# Patient Record
Sex: Female | Born: 1970 | Hispanic: No | Marital: Married | Smoking: Never smoker
Health system: Southern US, Community
[De-identification: ages and names within clinical notes are randomized; demographics above are authoritative.]

## PROBLEM LIST (undated history)

## (undated) DIAGNOSIS — R51 Headache: Secondary | ICD-10-CM

## (undated) DIAGNOSIS — R519 Headache, unspecified: Secondary | ICD-10-CM

## (undated) DIAGNOSIS — I10 Essential (primary) hypertension: Secondary | ICD-10-CM

## (undated) HISTORY — PX: SKIN GRAFT: SHX250

## (undated) HISTORY — DX: Essential (primary) hypertension: I10

## (undated) HISTORY — PX: EYE SURGERY: SHX253

## (undated) HISTORY — DX: Headache, unspecified: R51.9

## (undated) HISTORY — PX: TUBAL LIGATION: SHX77

## (undated) HISTORY — DX: Headache: R51

## (undated) HISTORY — PX: NASAL SINUS SURGERY: SHX719

---

## 1999-01-18 ENCOUNTER — Other Ambulatory Visit: Admission: RE | Admit: 1999-01-18 | Discharge: 1999-01-18 | Payer: Self-pay | Admitting: Gynecology

## 2001-01-26 ENCOUNTER — Other Ambulatory Visit: Admission: RE | Admit: 2001-01-26 | Discharge: 2001-01-26 | Payer: Self-pay | Admitting: Gynecology

## 2003-01-16 ENCOUNTER — Other Ambulatory Visit: Admission: RE | Admit: 2003-01-16 | Discharge: 2003-01-16 | Payer: Self-pay | Admitting: Obstetrics and Gynecology

## 2003-11-08 ENCOUNTER — Ambulatory Visit (HOSPITAL_COMMUNITY): Admission: RE | Admit: 2003-11-08 | Discharge: 2003-11-08 | Payer: Self-pay | Admitting: Internal Medicine

## 2004-04-01 ENCOUNTER — Encounter: Admission: RE | Admit: 2004-04-01 | Discharge: 2004-04-01 | Payer: Self-pay | Admitting: Otolaryngology

## 2006-09-01 ENCOUNTER — Other Ambulatory Visit: Admission: RE | Admit: 2006-09-01 | Discharge: 2006-09-01 | Payer: Self-pay | Admitting: Obstetrics and Gynecology

## 2012-03-05 ENCOUNTER — Other Ambulatory Visit: Payer: Self-pay | Admitting: Obstetrics and Gynecology

## 2012-03-05 NOTE — Telephone Encounter (Signed)
Carrie Buckley received 

## 2012-03-09 NOTE — Telephone Encounter (Signed)
lmtc @10 :25  ld

## 2012-03-13 ENCOUNTER — Other Ambulatory Visit: Payer: Self-pay | Admitting: Obstetrics and Gynecology

## 2012-03-13 NOTE — Telephone Encounter (Signed)
Triage/pt last seen in 2012 °

## 2012-03-13 NOTE — Telephone Encounter (Signed)
SR PT 

## 2012-03-14 NOTE — Telephone Encounter (Signed)
Pt sch for AEX on 03-16-12 ld

## 2012-03-14 NOTE — Telephone Encounter (Signed)
Requesting refill on naproxen.  This ok or need appt?   ld

## 2012-03-14 NOTE — Telephone Encounter (Signed)
Always include last visit in your note and/or last AEX

## 2012-03-16 ENCOUNTER — Encounter: Payer: Self-pay | Admitting: Obstetrics and Gynecology

## 2012-03-16 ENCOUNTER — Ambulatory Visit (INDEPENDENT_AMBULATORY_CARE_PROVIDER_SITE_OTHER): Payer: PRIVATE HEALTH INSURANCE | Admitting: Obstetrics and Gynecology

## 2012-03-16 VITALS — BP 126/70 | Ht 65.0 in | Wt 170.0 lb

## 2012-03-16 DIAGNOSIS — Z01419 Encounter for gynecological examination (general) (routine) without abnormal findings: Secondary | ICD-10-CM

## 2012-03-16 DIAGNOSIS — Z124 Encounter for screening for malignant neoplasm of cervix: Secondary | ICD-10-CM

## 2012-03-16 MED ORDER — NAPROXEN SODIUM 550 MG PO TABS: 550.0000 mg | ORAL_TABLET | Freq: Two times a day (BID) | ORAL | Status: AC

## 2012-03-16 NOTE — Progress Notes (Signed)
The patient reports:no complaints  Contraception:bilateral tubal ligation  Last mammogram: not applicable Last pap: approximate date 11/2010 and was normal   GC/Chlamydia cultures offered: declined HIV/RPR/HbsAg offered:  declined HSV 1 and 2 glycoprotein offered: declined  Menstrual cycle regular and monthly: Yes Menstrual flow normal: Yes  Urinary symptoms: none Normal bowel movements: Yes Reports abuse at home: No:   Subjective:    Carrie Buckley is a 41 y.o. female, G1P1, who presents for an annual exam.     History   Social History  . Marital Status: Married    Spouse Name: N/A    Number of Children: N/A  . Years of Education: N/A   Social History Main Topics  . Smoking status: Never Smoker   . Smokeless tobacco: Never Used  . Alcohol Use: No  . Drug Use: No  . Sexually Active: Yes    Birth Control/ Protection: Surgical   Other Topics Concern  . None   Social History Narrative  . None    Menstrual cycle:   LMP: Patient's last menstrual period was 03/12/2012.           Cycle: monthly with controlled dysmenorrhea  With Anaprox Ds  The following portions of the patient's history were reviewed and updated as appropriate: allergies, current medications, past family history, past medical history, past social history, past surgical history and problem list.  Review of Systems Pertinent items are noted in HPI. Breast:Negative for breast lump,nipple discharge or nipple retraction Gastrointestinal: Negative for abdominal pain, change in bowel habits or rectal bleeding Urinary:negative   Objective:    BP 126/70  Ht 5\' 5"  (1.651 m)  Wt 170 lb (77.111 kg)  BMI 28.29 kg/m2  LMP 03/12/2012    Weight:  Wt Readings from Last 1 Encounters:  03/16/12 170 lb (77.111 kg)          BMI: Body mass index is 28.29 kg/(m^2).  General Appearance: Alert, appropriate appearance for age. No acute distress HEENT: Grossly normal Neck / Thyroid: Supple, no masses, nodes or  enlargement Lungs: clear to auscultation bilaterally Back: No CVA tenderness Breast Exam: No masses or nodes.No dimpling, nipple retraction or discharge. Cardiovascular: Regular rate and rhythm. S1, S2, no murmur Gastrointestinal: Soft, non-tender, no masses or organomegaly Pelvic Exam: Vulva and vagina appear normal. Bimanual exam reveals normal uterus and adnexa. Rectovaginal: not indicated Lymphatic Exam: Non-palpable nodes in neck, clavicular, axillary, or inguinal regions Skin: no rash or abnormalities Neurologic: Normal gait and speech, no tremor  Psychiatric: Alert and oriented, appropriate affect.    Assessment:    Normal gyn exam    Plan:    pap smear return annually or prn STD screening: declined Contraception:bilateral tubal ligation Anaprox DS      Blessen Kimbrough AMD

## 2012-03-22 LAB — PAP IG W/ RFLX HPV ASCU

## 2012-05-06 ENCOUNTER — Ambulatory Visit (INDEPENDENT_AMBULATORY_CARE_PROVIDER_SITE_OTHER): Payer: PRIVATE HEALTH INSURANCE | Admitting: Family Medicine

## 2012-05-06 VITALS — BP 107/68 | HR 88 | Temp 98.6°F | Resp 16 | Ht 64.25 in | Wt 171.0 lb

## 2012-05-06 DIAGNOSIS — R05 Cough: Secondary | ICD-10-CM

## 2012-05-06 DIAGNOSIS — J329 Chronic sinusitis, unspecified: Secondary | ICD-10-CM

## 2012-05-06 DIAGNOSIS — R059 Cough, unspecified: Secondary | ICD-10-CM

## 2012-05-06 MED ORDER — CEFDINIR 300 MG PO CAPS: 300.0000 mg | ORAL_CAPSULE | Freq: Two times a day (BID) | ORAL | Status: AC

## 2012-05-06 MED ORDER — HYDROCODONE-HOMATROPINE 5-1.5 MG/5ML PO SYRP: 5.0000 mL | ORAL_SOLUTION | Freq: Three times a day (TID) | ORAL | Status: AC | PRN

## 2012-05-06 NOTE — Progress Notes (Signed)
Date:  05/06/2012   Name:  Carrie Buckley   DOB:  [birth year]   MRN:  [id number]  PCP:  Esmeralda Arthur, MD    Chief Complaint: Cough and Nasal Congestion   History of Present Illness:  Carrie Buckley is a 41 y.o. very pleasant female patient who presents with the following:  She has noted a severe cough, sinus pressure, and some productive cough for about a week.  Her sinuses are starting to hurt. She has not noted a fever.  Some ST from PND, no nasal symtoms.  No GI symptoms. No sick contacts.  She has tried some OTC medications and veramyst that she had at home.    S/p BTL. She is generally healthy  There is no problem list on file for this patient.   Past Medical History  Diagnosis Date  . Hypertension     Past Surgical History  Procedure Date  . Tubal ligation   . Cesarean section     History  Substance Use Topics  . Smoking status: Never Smoker   . Smokeless tobacco: Never Used  . Alcohol Use: No    Family History  Problem Relation Age of Onset  . Cancer Maternal Aunt     colon  . Hypertension Maternal Aunt   . Hypertension Maternal Uncle   . Hypertension Paternal Aunt   . Hypertension Paternal Uncle     No Known Allergies  Medication list has been reviewed and updated.  Current Outpatient Prescriptions on File Prior to Visit  Medication Sig Dispense Refill  . cyanocobalamin 2000 MCG tablet Take 2,000 mcg by mouth daily.      . Flaxseed, Linseed, (FLAX SEED OIL PO) Take by mouth.      . loratadine-pseudoephedrine (CLARITIN-D 24-HOUR) 10-240 MG per 24 hr tablet Take 1 tablet by mouth daily.      . Multiple Vitamin (MULTIVITAMIN) tablet Take 1 tablet by mouth daily.      . naproxen sodium (ANAPROX DS) 550 MG tablet Take 1 tablet (550 mg total) by mouth 2 (two) times daily with a meal.  30 tablet  11  . pyridoxine (RA VITAMIN B-6) 100 MG tablet Take 100 mg by mouth daily.        Review of Systems:  As per HPI- otherwise  negative. Marland Kitchen Physical Examination: Filed Vitals:   05/06/12 1740  BP: 107/68  Pulse: 88  Temp: 98.6 F (37 C)  Resp: 16   Filed Vitals:   05/06/12 1740  Height: 5' 4.25" (1.632 m)  Weight: 171 lb (77.565 kg)   Body mass index is 29.12 kg/(m^2). Ideal Body Weight: Weight in (lb) to have BMI = 25: 146.5   GEN: WDWN, NAD, Non-toxic, A & O x 3 HEENT: Atraumatic, Normocephalic. Neck supple. No masses, No LAD.  PEERL, EOMI, oropharynx wnl, TM wnl.  Sinuses are tender to percussion, nasal cavity wnl Ears and Nose: No external deformity. CV: RRR, No M/G/R. No JVD. No thrill. No extra heart sounds. PULM: CTA B, no wheezes, crackles, rhonchi. No retractions. No resp. distress. No accessory muscle use. ABD: S, NT, ND, +BS. No rebound. No HSM. EXTR: No c/c/e NEURO Normal gait.  PSYCH: Normally interactive. Conversant. Not depressed or anxious appearing.  Calm demeanor.    Assessment and Plan: 1. Cough  HYDROcodone-homatropine (HYCODAN) 5-1.5 MG/5ML syrup  2. Sinusitis  cefdinir (OMNICEF) 300 MG capsule   Treat as above for sinusitis and cough/ bronchitis.  Patient (or parent if minor) instructed  to return to clinic or call if not better in 2-3 day(s).   Abbe Amsterdam, MD

## 2012-11-09 ENCOUNTER — Ambulatory Visit (INDEPENDENT_AMBULATORY_CARE_PROVIDER_SITE_OTHER): Payer: PRIVATE HEALTH INSURANCE | Admitting: Internal Medicine

## 2012-11-09 VITALS — BP 108/70 | HR 83 | Temp 98.6°F | Resp 18 | Ht 65.0 in | Wt 175.0 lb

## 2012-11-09 DIAGNOSIS — R110 Nausea: Secondary | ICD-10-CM

## 2012-11-09 DIAGNOSIS — G43909 Migraine, unspecified, not intractable, without status migrainosus: Secondary | ICD-10-CM

## 2012-11-09 DIAGNOSIS — H53149 Visual discomfort, unspecified: Secondary | ICD-10-CM

## 2012-11-09 MED ORDER — PROMETHAZINE HCL 25 MG PO TABS: 25.0000 mg | ORAL_TABLET | Freq: Three times a day (TID) | ORAL | Status: DC | PRN

## 2012-11-09 MED ORDER — KETOROLAC TROMETHAMINE 60 MG/2ML IM SOLN
60.0000 mg | Freq: Once | INTRAMUSCULAR | Status: AC
  Administered 2012-11-09: 60 mg via INTRAMUSCULAR

## 2012-11-09 MED ORDER — HYDROCODONE-ACETAMINOPHEN 5-325 MG PO TABS: 1.0000 | ORAL_TABLET | Freq: Four times a day (QID) | ORAL | Status: DC | PRN

## 2012-11-09 NOTE — Patient Instructions (Signed)
Migraine Headache A migraine headache is an intense, throbbing pain on one or both sides of your head. A migraine can last for 30 minutes to several hours. CAUSES  The exact cause of a migraine headache is not always known. However, a migraine may be caused when nerves in the brain become irritated and release chemicals that cause inflammation. This causes pain. SYMPTOMS  Pain on one or both sides of your head.  Pulsating or throbbing pain.  Severe pain that prevents daily activities.  Pain that is aggravated by any physical activity.  Nausea, vomiting, or both.  Dizziness.  Pain with exposure to bright lights, loud noises, or activity.  General sensitivity to bright lights, loud noises, or smells. Before you get a migraine, you may get warning signs that a migraine is coming (aura). An aura may include:  Seeing flashing lights.  Seeing bright spots, halos, or zig-zag lines.  Having tunnel vision or blurred vision.  Having feelings of numbness or tingling.  Having trouble talking.  Having muscle weakness. MIGRAINE TRIGGERS  Alcohol.  Smoking.  Stress.  Menstruation.  Aged cheeses.  Foods or drinks that contain nitrates, glutamate, aspartame, or tyramine.  Lack of sleep.  Chocolate.  Caffeine.  Hunger.  Physical exertion.  Fatigue.  Medicines used to treat chest pain (nitroglycerine), birth control pills, estrogen, and some blood pressure medicines. DIAGNOSIS  A migraine headache is often diagnosed based on:  Symptoms.  Physical examination.  A CT scan or MRI of your head. TREATMENT Medicines may be given for pain and nausea. Medicines can also be given to help prevent recurrent migraines.  HOME CARE INSTRUCTIONS  Only take over-the-counter or prescription medicines for pain or discomfort as directed by your caregiver. The use of long-term narcotics is not recommended.  Lie down in a dark, quiet room when you have a migraine.  Keep a journal  to find out what may trigger your migraine headaches. For example, write down:  What you eat and drink.  How much sleep you get.  Any change to your diet or medicines.  Limit alcohol consumption.  Quit smoking if you smoke.  Get 7 to 9 hours of sleep, or as recommended by your caregiver.  Limit stress.  Keep lights dim if bright lights bother you and make your migraines worse. SEEK IMMEDIATE MEDICAL CARE IF:   Your migraine becomes severe.  You have a fever.  You have a stiff neck.  You have vision loss.  You have muscular weakness or loss of muscle control.  You start losing your balance or have trouble walking.  You feel faint or pass out.  You have severe symptoms that are different from your first symptoms. MAKE SURE YOU:   Understand these instructions.  Will watch your condition.  Will get help right away if you are not doing well or get worse. Document Released: 10/10/2005 Document Revised: 01/02/2012 Document Reviewed: 09/30/2011 [employer] Patient Information 2013 [employer].  

## 2012-11-09 NOTE — Progress Notes (Signed)
  Subjective:    Patient ID: Carrie Buckley, female    DOB: 1971-08-22, 42 y.o.   MRN: [id number]  HPI Has HA for 5 days.Photophobia, nausea, no vomiting. Hx of full migraine eval, ct head and sinus , and neurology eval 2005. At this time has no focal motor or sensory loss. No definite fhx of migrain. Has not had migrain in months.   Review of Systems neg    Objective:   Physical Exam  Constitutional: She is oriented to person, place, and time. She appears well-developed and well-nourished. She appears distressed.  HENT:  Right Ear: External ear normal.  Left Ear: External ear normal.  Nose: Nose normal.  Eyes: EOM are normal. Pupils are equal, round, and reactive to light.  Neck: Normal range of motion. Neck supple.  Cardiovascular: Normal rate.   Pulmonary/Chest: Effort normal.  Neurological: She is alert and oriented to person, place, and time. She has normal reflexes. She displays no tremor. No cranial nerve deficit or sensory deficit. She exhibits normal muscle tone. She displays a negative Romberg sign. Coordination and gait normal.       No drift Balance fully intact          Assessment & Plan:  Toradol 60mg  im Phenergan 25mg  for nausea, migrain, and sleep HC for rescue Needs primary care to plan migraine prevention

## 2014-07-07 ENCOUNTER — Other Ambulatory Visit: Payer: Self-pay | Admitting: Otolaryngology

## 2014-07-07 DIAGNOSIS — R42 Dizziness and giddiness: Secondary | ICD-10-CM

## 2014-07-18 ENCOUNTER — Ambulatory Visit
Admission: RE | Admit: 2014-07-18 | Discharge: 2014-07-18 | Disposition: A | Discharge status: Home / Self Care | Payer: PRIVATE HEALTH INSURANCE | Source: Ambulatory Visit | Attending: Otolaryngology | Admitting: Otolaryngology

## 2014-07-18 DIAGNOSIS — R42 Dizziness and giddiness: Secondary | ICD-10-CM

## 2014-07-31 ENCOUNTER — Encounter (INDEPENDENT_AMBULATORY_CARE_PROVIDER_SITE_OTHER): Payer: Self-pay

## 2014-07-31 ENCOUNTER — Ambulatory Visit (INDEPENDENT_AMBULATORY_CARE_PROVIDER_SITE_OTHER): Payer: PRIVATE HEALTH INSURANCE | Admitting: Neurology

## 2014-07-31 ENCOUNTER — Encounter: Payer: Self-pay | Admitting: Neurology

## 2014-07-31 VITALS — BP 130/92 | HR 92 | Ht 65.0 in | Wt 179.8 lb

## 2014-07-31 DIAGNOSIS — I639 Cerebral infarction, unspecified: Secondary | ICD-10-CM

## 2014-07-31 DIAGNOSIS — R42 Dizziness and giddiness: Secondary | ICD-10-CM | POA: Insufficient documentation

## 2014-07-31 DIAGNOSIS — G43709 Chronic migraine without aura, not intractable, without status migrainosus: Secondary | ICD-10-CM

## 2014-07-31 MED ORDER — TOPIRAMATE 25 MG PO TABS: ORAL_TABLET | ORAL | Status: DC

## 2014-07-31 NOTE — Patient Instructions (Signed)

## 2014-07-31 NOTE — Progress Notes (Signed)
Reason for visit: Dizziness  Carrie Buckley is a 43 y.o. female  History of present illness:  Carrie Buckley is a 43 year old left-handed white female with a history of migraine headache. The patient has done quite well with her migraine until May of 2015. The patient has not had any headaches for almost 10 years. The patient was on a cruise, and then several days after the cruise, she had an episode of visual loss, and then the next day she had a severe migraine headache. The patient indicates that she oftentimes will have visual aura prior to her headaches. Beginning in July 2015, the patient began having brief episodes of lightheaded or floaty feelings that would last only a few seconds, but may occur multiple times during the day. The patient indicates that she usually does not have these events while lying down, but they may occur while sitting or standing. The patient has also concurrently noted onset of visual aura that seemed to simulate what she may get prior to her headache. The patient is also having alterations in her peripheral vision associated with sparkling lights bilaterally. The patient reports no numbness or weakness on the face, arms, or legs. She denies any significant change in balance, and she has had no weakness of the extremities. She has not had any double vision, or slurred speech. The patient has been seen by Dr. Gean Quint, and for MRI evaluation of the brain. The patient was found to have small areas within the left cerebellum consistent with a chronic infarction. Upon my review, the areas appeared to be almost cystic. The patient has also recently developed some slight neck discomfort and some pain into the interscapular area on the right that began about 3 weeks ago. The patient denies any pain down the arms. She has not had any problems controlling the bowels or the bladder. She does report some occasional episodes of chills. She is sent to this office for an  evaluation.  Past Medical History  Diagnosis Date  . Hypertension   . Headache     Past Surgical History  Procedure Laterality Date  . Tubal ligation    . Cesarean section    . Nasal sinus surgery      Family History  Problem Relation Age of Onset  . Cancer Maternal Aunt     colon  . Hypertension Maternal Aunt   . Hypertension Maternal Uncle   . Hypertension Paternal Aunt   . Hypertension Paternal Uncle   . Cancer Mother   . Migraines Neg Hx     Social history:  reports that she has never smoked. She has never used smokeless tobacco. She reports that she does not drink alcohol or use illicit drugs.  Medications:  Current Outpatient Prescriptions on File Prior to Visit  Medication Sig Dispense Refill  . cyanocobalamin 2000 MCG tablet Take 2,000 mcg by mouth daily.      . Flaxseed, Linseed, (FLAX SEED OIL PO) Take by mouth.      Marland Kitchen HYDROcodone-acetaminophen (NORCO/VICODIN) 5-325 MG per tablet Take 1 tablet by mouth every 6 (six) hours as needed for pain.  30 tablet  0  . loratadine-pseudoephedrine (CLARITIN-D 24-HOUR) 10-240 MG per 24 hr tablet Take 1 tablet by mouth daily.      . Multiple Vitamin (MULTIVITAMIN) tablet Take 1 tablet by mouth daily.      Marland Kitchen pyridoxine (RA VITAMIN B-6) 100 MG tablet Take 100 mg by mouth daily.       No  current facility-administered medications on file prior to visit.     No Known Allergies  ROS:  Out of a complete 14 system review of symptoms, the patient complains only of the following symptoms, and all other reviewed systems are negative.  Wheezing Allergies Headache Dizziness  Blood pressure 130/92, pulse 92, height 5\' 5"  (1.651 m), weight 179 lb 12.8 oz (81.557 kg).  Physical Exam  General: The patient is alert and cooperative at the time of the examination. The patient is minimally obese.  Eyes: Pupils are equal, round, and reactive to light. Discs are flat bilaterally.  Neck: The neck is supple, no carotid bruits are  noted.  Respiratory: The respiratory examination is clear.  Cardiovascular: The cardiovascular examination reveals a regular rate and rhythm, no obvious murmurs or rubs are noted.  Skin: Extremities are without significant edema.  Neurologic Exam  Mental status: The patient is alert and oriented x 3 at the time of the examination. The patient has apparent normal recent and remote memory, with an apparently normal attention span and concentration ability.  Cranial nerves: Facial symmetry is present. There is good sensation of the face to pinprick and soft touch bilaterally. The strength of the facial muscles and the muscles to head turning and shoulder shrug are normal bilaterally. Speech is well enunciated, no aphasia or dysarthria is noted. Extraocular movements are full. On primary gaze, there is exotropia of the left eye. Visual fields are full. The tongue is midline, and the patient has symmetric elevation of the soft palate. No obvious hearing deficits are noted.  Motor: The motor testing reveals 5 over 5 strength of all 4 extremities. Good symmetric motor tone is noted throughout.  Sensory: Sensory testing is intact to pinprick, soft touch, vibration sensation, and position sense on all 4 extremities. No evidence of extinction is noted.  Coordination: Cerebellar testing reveals good finger-nose-finger and heel-to-shin bilaterally.  Gait and station: Gait is normal. Tandem gait is normal. Romberg is negative. No drift is seen.  Reflexes: Deep tendon reflexes are symmetric and normal bilaterally. Toes are downgoing bilaterally.   Assessment/Plan:  1. 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 History of migraine headache  3. Episodic dizziness, light headed sensations  I am not clear that the MRI findings are related to her current symptoms. The patient reports that the episodes of brief dizziness are daily in nature, and quite frequent. The onset of  the symptoms began in conjunction with her usual migraine visual aura. The patient has continued to have episodic scintillating scotoma in the peripheral vision since July of 2015, unassociated with headache. The episodes of dizziness may represent migraine equivalent. The patient will be placed on Topamax, and she will be sent for a cerebrovascular workup to include MRA of the head and neck, and blood work to evaluate her for a hypercoagulable state. The patient will followup in about 3 months. The MRI of the brain can be followed over time, and we may consider rechecking a study in about one year.  Marlan Palau. Keith Willis MD 07/31/2014 7:54 PM  [facility] [address] [place], [place] [id number]  Phone [phone] Fax [phone]

## 2014-08-04 LAB — CARDIOLIPIN ANTIBODY
Anticardiolipin IgA: <9 APL U/mL (ref 0–11)
Anticardiolipin IgG: <9 GPL U/mL (ref 0–14)
Anticardiolipin IgM: <9 MPL U/mL (ref 0–12)

## 2014-08-04 LAB — LUPUS ANTICOAGULANT
Dilute Viper Venom Time: 32.7 s (ref 0.0–55.1)
PTT Lupus Anticoagulant: 30.7 s (ref 0.0–50.0)
Thrombin Time: 16.3 s (ref 0.0–20.0)
dPT Confirm Ratio: 1.03 Ratio (ref 0.00–1.20)
dPT: 43.2 s (ref 0.0–55.0)

## 2014-08-04 LAB — SEDIMENTATION RATE: Sed Rate: 8 mm/hr (ref 0–32)

## 2014-08-04 LAB — FACTOR 5 LEIDEN

## 2014-08-04 LAB — ANA W/REFLEX: Anti Nuclear Antibody(ANA): NEGATIVE

## 2014-08-11 ENCOUNTER — Ambulatory Visit
Admission: RE | Admit: 2014-08-11 | Discharge: 2014-08-11 | Disposition: A | Discharge status: Home / Self Care | Payer: PRIVATE HEALTH INSURANCE | Source: Ambulatory Visit | Attending: Neurology | Admitting: Neurology

## 2014-08-11 DIAGNOSIS — I639 Cerebral infarction, unspecified: Secondary | ICD-10-CM

## 2014-08-11 DIAGNOSIS — R42 Dizziness and giddiness: Secondary | ICD-10-CM

## 2014-08-11 DIAGNOSIS — G43709 Chronic migraine without aura, not intractable, without status migrainosus: Secondary | ICD-10-CM

## 2014-08-11 MED ORDER — GADOBENATE DIMEGLUMINE 529 MG/ML IV SOLN
15.0000 mL | Freq: Once | INTRAVENOUS | Status: AC | PRN
  Administered 2014-08-11: 15 mL via INTRAVENOUS

## 2014-08-12 ENCOUNTER — Telehealth: Payer: Self-pay | Admitting: Neurology

## 2014-08-12 NOTE — Telephone Encounter (Signed)
  I called patient. 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 The patient has a chronic left cerebellar stroke, and this could correlate with the vertebral artery disease. The patient will go on low-dose aspirin at this time. She will followup in about 3 months. I am not sure that the old chronic left cerebellar stroke is the etiology of her symptoms of dizziness.   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 This may be a source of emboli causing the left cerebellar infarction noted on prior MRI brain. No other significant vertebral or carotid stenosis identified.  Negative MRA head.

## 2014-08-19 ENCOUNTER — Telehealth: Payer: Self-pay | Admitting: Neurology

## 2014-08-19 MED ORDER — TOPIRAMATE 25 MG PO TABS: 75.0000 mg | ORAL_TABLET | Freq: Every day | ORAL | Status: DC

## 2014-08-19 NOTE — Telephone Encounter (Signed)
I called the patient. The patient may go up to 75 mg daily of the Topamax. I will call in another prescription to cover this increase.

## 2014-08-19 NOTE — Telephone Encounter (Signed)
Patient calling to ask whether it is okay for her to increase her Topamax dosage to 3 pills a day, please return call and advise.

## 2014-08-25 ENCOUNTER — Encounter: Payer: Self-pay | Admitting: Neurology

## 2014-09-15 ENCOUNTER — Telehealth: Payer: Self-pay | Admitting: Neurology

## 2014-09-15 MED ORDER — TOPIRAMATE 100 MG PO TABS: 150.0000 mg | ORAL_TABLET | Freq: Every day | ORAL | Status: DC

## 2014-09-15 NOTE — Telephone Encounter (Signed)
I called patient. The patient is tolerating 125 mg at night of the Topamax, okay to go to 150 mg, the patient believes that this is helping her.

## 2014-09-15 NOTE — Telephone Encounter (Signed)
What do you think??

## 2014-09-15 NOTE — Telephone Encounter (Signed)
Pt is calling regarding topiramate (TOPAMAX) 25 MG tablet she has increased to 5 pills a day about a week ago. She is taking 125 mg daily. She wants to know if she can go to 6 pills a day.  Please call and advise.

## 2014-11-18 ENCOUNTER — Ambulatory Visit (INDEPENDENT_AMBULATORY_CARE_PROVIDER_SITE_OTHER): Payer: PRIVATE HEALTH INSURANCE | Admitting: Neurology

## 2014-11-18 ENCOUNTER — Encounter: Payer: Self-pay | Admitting: Neurology

## 2014-11-18 VITALS — BP 113/78 | HR 95 | Ht 65.0 in | Wt 175.2 lb

## 2014-11-18 DIAGNOSIS — R42 Dizziness and giddiness: Secondary | ICD-10-CM

## 2014-11-18 DIAGNOSIS — G43709 Chronic migraine without aura, not intractable, without status migrainosus: Secondary | ICD-10-CM

## 2014-11-18 NOTE — Patient Instructions (Addendum)
  Begin taking Topamax 50 mg in the morning, 100 mg in the evening. Call our office if this is not effective. Migraine Headache A migraine headache is an intense, throbbing pain on one or both sides of your head. A migraine can last for 30 minutes to several hours. CAUSES  The exact cause of a migraine headache is not always known. However, a migraine may be caused when nerves in the brain become irritated and release chemicals that cause inflammation. This causes pain. Certain things may also trigger migraines, such as:  Alcohol.  Smoking.  Stress.  Menstruation.  Aged cheeses.  Foods or drinks that contain nitrates, glutamate, aspartame, or tyramine.  Lack of sleep.  Chocolate.  Caffeine.  Hunger.  Physical exertion.  Fatigue.  Medicines used to treat chest pain (nitroglycerine), birth control pills, estrogen, and some blood pressure medicines. SIGNS AND SYMPTOMS  Pain on one or both sides of your head.  Pulsating or throbbing pain.  Severe pain that prevents daily activities.  Pain that is aggravated by any physical activity.  Nausea, vomiting, or both.  Dizziness.  Pain with exposure to bright lights, loud noises, or activity.  General sensitivity to bright lights, loud noises, or smells. Before you get a migraine, you may get warning signs that a migraine is coming (aura). An aura may include:  Seeing flashing lights.  Seeing bright spots, halos, or zigzag lines.  Having tunnel vision or blurred vision.  Having feelings of numbness or tingling.  Having trouble talking.  Having muscle weakness. DIAGNOSIS  A migraine headache is often diagnosed based on:  Symptoms.  Physical exam.  A CT scan or MRI of your head. These imaging tests cannot diagnose migraines, but they can help rule out other causes of headaches. TREATMENT Medicines may be given for pain and nausea. Medicines can also be given to help prevent recurrent migraines.  HOME CARE  INSTRUCTIONS  Only take over-the-counter or prescription medicines for pain or discomfort as directed by your health care provider. The use of long-term narcotics is not recommended.  Lie down in a dark, quiet room when you have a migraine.  Keep a journal to find out what may trigger your migraine headaches. For example, write down:  What you eat and drink.  How much sleep you get.  Any change to your diet or medicines.  Limit alcohol consumption.  Quit smoking if you smoke.  Get 7-9 hours of sleep, or as recommended by your health care provider.  Limit stress.  Keep lights dim if bright lights bother you and make your migraines worse. SEEK IMMEDIATE MEDICAL CARE IF:   Your migraine becomes severe.  You have a fever.  You have a stiff neck.  You have vision loss.  You have muscular weakness or loss of muscle control.  You start losing your balance or have trouble walking.  You feel faint or pass out.  You have severe symptoms that are different from your first symptoms. MAKE SURE YOU:   Understand these instructions.  Will watch your condition.  Will get help right away if you are not doing well or get worse. Document Released: 10/10/2005 Document Revised: 02/24/2014 Document Reviewed: 06/17/2013 [facility] Patient Information 2015 [employer], [place]. This information is not intended to replace advice given to you by your health care provider. Make sure you discuss any questions you have with your health care provider.

## 2014-11-18 NOTE — Progress Notes (Signed)
Reason for visit: Migraine headache  Carrie Buckley is an 44 y.o. female  History of present illness:  Carrie Buckley is a 44 year old left-handed white female with a history of migraine headaches. The patient had sudden conversion to having daily symptoms that are associated with dizziness and some blurring of vision. The patient has been placed on Topamax with good improvement in her symptoms, but just prior to her evening dose, she may begin to have some dizziness and blurring of vision. She had one headache approximately one week prior to this visit. She is having very occasional headaches. She will have some tingling sensations in the face and in the hands on occasion associated with the Topamax. She has had episodes of left-sided numbness that have come and gone as well. 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 The patient is on aspirin. A hypercoagulable state workup was unremarkable. She returns for an evaluation.  Past Medical History  Diagnosis Date  . Hypertension   . Headache     Past Surgical History  Procedure Laterality Date  . Tubal ligation    . Cesarean section    . Nasal sinus surgery      Family History  Problem Relation Age of Onset  . Cancer Maternal Aunt     colon  . Hypertension Maternal Aunt   . Hypertension Maternal Uncle   . Hypertension Paternal Aunt   . Hypertension Paternal Uncle   . Cancer Mother   . Migraines Neg Hx     Social history:  reports that she has never smoked. She has never used smokeless tobacco. She reports that she does not drink alcohol or use illicit drugs.   No Known Allergies  Medications:  Current Outpatient Prescriptions on File Prior to Visit  Medication Sig Dispense Refill  . cyanocobalamin 2000 MCG tablet Take 2,000 mcg by mouth  daily.    Marland Kitchen HYDROcodone-acetaminophen (NORCO/VICODIN) 5-325 MG per tablet Take 1 tablet by mouth every 6 (six) hours as needed for pain. 30 tablet 0  . Multiple Vitamin (MULTIVITAMIN) tablet Take 1 tablet by mouth daily.    Marland Kitchen pyridoxine (RA VITAMIN B-6) 100 MG tablet Take 100 mg by mouth daily.    Marland Kitchen topiramate (TOPAMAX) 100 MG tablet Take 1.5 tablets (150 mg total) by mouth at bedtime. 50 tablet 3   No current facility-administered medications on file prior to visit.    ROS:  Out of a complete 14 system review of symptoms, the patient complains only of the following symptoms, and all other reviewed systems are negative.  Dizziness Blurred vision  Blood pressure 113/78, pulse 95, height  (1.651 m), weight 175 lb 3.2 oz (79.47 kg).  Physical Exam  General: The patient is alert and cooperative at the time of the examination. The patient is minimally obese.  Skin: No significant peripheral edema is noted.   Neurologic Exam  Mental status: The patient is oriented x 3.  Cranial nerves: Facial symmetry is present. Speech is normal, no aphasia or dysarthria is noted. Extraocular movements are full. Visual fields are full.  Motor: The patient has good strength in all 4 extremities.  Sensory examination: Soft touch sensation is symmetric on the face, arms, and legs.  Coordination: The patient has good finger-nose-finger and heel-to-shin bilaterally.  Gait and station: The patient  has a normal gait. Tandem gait is normal. Romberg is negative. No drift is seen.  Reflexes: Deep tendon reflexes are symmetric.   Assessment/Plan:  1. Migraine headache  2. Abnormal MRI brain, chronic left cerebellar infarct  The patient is doing relatively well this point, readjusting the dosing of the Topamax may benefit her symptoms in the evening. The patient will take 50 mg of Topamax in the morning, 100 mg in the evening. If this is not sufficient, we will convert to 100 mg twice daily. The  patient will follow-up in 4 or 5 months.  Marlan Palau. Keith Willis MD 11/18/2014 9:00 AM  [facility] [address] [place], [place] [id number]  Phone [phone] Fax [phone]

## 2014-12-29 ENCOUNTER — Other Ambulatory Visit: Payer: Self-pay | Admitting: Neurology

## 2015-02-17 ENCOUNTER — Other Ambulatory Visit: Payer: Self-pay

## 2015-02-17 MED ORDER — TOPIRAMATE 100 MG PO TABS: 100.0000 mg | ORAL_TABLET | Freq: Two times a day (BID) | ORAL | Status: DC

## 2015-02-17 NOTE — Telephone Encounter (Signed)
[place] [facility] Topamax 100mg  two daily.  Last OV note says: The patient will take 50 mg of Topamax in the morning, 100 mg in the evening. If this is not sufficient, we will convert to 100 mg twice daily.  Rx updated and sent to provider for review/approval.

## 2015-03-19 ENCOUNTER — Ambulatory Visit: Payer: PRIVATE HEALTH INSURANCE | Admitting: Adult Health

## 2015-04-14 ENCOUNTER — Encounter: Payer: Self-pay | Admitting: Adult Health

## 2015-04-14 ENCOUNTER — Ambulatory Visit (INDEPENDENT_AMBULATORY_CARE_PROVIDER_SITE_OTHER): Payer: PRIVATE HEALTH INSURANCE | Admitting: Adult Health

## 2015-04-14 VITALS — BP 113/77 | HR 90 | Ht 65.0 in | Wt 167.0 lb

## 2015-04-14 DIAGNOSIS — R519 Headache, unspecified: Secondary | ICD-10-CM

## 2015-04-14 DIAGNOSIS — R51 Headache: Secondary | ICD-10-CM

## 2015-04-14 NOTE — Patient Instructions (Signed)
Continue Topamax.  If your symptoms worsen please let us know.  We will repeat MRI of brain at the next visit.

## 2015-04-14 NOTE — Progress Notes (Signed)
PATIENT: Carrie Buckley DOB: 1971/01/25  REASON FOR VISIT: follow up- Migraine HISTORY FROM: patient  HISTORY OF PRESENT ILLNESS: Carrie Buckley is a 44 year old with a history of migraine headaches. She returns today for follow-up. The patient is currently taking Topamax 200 mg at bedtime. She reports that this is working well for her. She states that her symptoms have improved. She states that she has not had a severe migraine in several months. She states that she did have a mild headache one month ago. She states that every once in a while she'll have lightheadedness, feel off balance and dizziness. This is usually associated when her medication tends to wear off or weather related. The patient continues to have intermittent numbness and tingling in the hands and feet associated with Topamax. She denies any cognitive changes since starting Topamax. She states that she feels that she is "sharper" since she started the Topamax. She denies any new medical issues. She returns today for evaluation  HISTORY 11/18/14 Anne Hahn): Carrie Buckley is a 44 year old left-handed white female with a history of migraine headaches. The patient had sudden conversion to having daily symptoms that are associated with dizziness and some blurring of vision. The patient has been placed on Topamax with good improvement in her symptoms, but just prior to her evening dose, she may begin to have some dizziness and blurring of vision. She had one headache approximately one week prior to this visit. She is having very occasional headaches. She will have some tingling sensations in the face and in the hands on occasion associated with the Topamax. She has had episodes of left-sided numbness that have come and gone as well. 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 The patient is on aspirin. A hypercoagulable state workup was unremarkable. She returns for an evaluation.  REVIEW OF SYSTEMS: Out of a complete 14 system review of symptoms, the patient complains only of the following symptoms, and all other reviewed systems are negative.  See history of present illness  ALLERGIES: No Known Allergies  HOME MEDICATIONS: Outpatient Prescriptions Prior to Visit  Medication Sig Dispense Refill  . aspirin 81 MG tablet Take 81 mg by mouth daily.    . cyanocobalamin 2000 MCG tablet Take 2,000 mcg by mouth daily.    Marland Kitchen HYDROcodone-acetaminophen (NORCO/VICODIN) 5-325 MG per tablet Take 1 tablet by mouth every 6 (six) hours as needed for pain. 30 tablet 0  . Multiple Vitamin (MULTIVITAMIN) tablet Take 1 tablet by mouth daily.    Marland Kitchen pyridoxine (RA VITAMIN B-6) 100 MG tablet Take 100 mg by mouth daily.    Marland Kitchen topiramate (TOPAMAX) 100 MG tablet Take 1 tablet (100 mg total) by mouth 2 (two) times daily. 60 tablet 5   No facility-administered medications prior to visit.    PAST MEDICAL HISTORY: Past Medical History  Diagnosis Date  . Hypertension   . Headache     PAST SURGICAL HISTORY: Past Surgical History  Procedure Laterality Date  . Tubal ligation    . Cesarean section    . Nasal sinus surgery      FAMILY HISTORY: Family History  Problem Relation Age of Onset  . Cancer Maternal Aunt     colon  . Hypertension Maternal Aunt   . Hypertension Maternal Uncle   . Hypertension Paternal Aunt   .  Hypertension Paternal Uncle   . Cancer Mother   . Migraines Neg Hx     SOCIAL HISTORY: History   Social History  . Marital Status: Married    Spouse Name: N/A  . Number of Children: 1  . Years of Education: N/A   Occupational History  . Not on file.   Social History Main Topics  . Smoking status: Never Smoker   . Smokeless tobacco: Never Used  . Alcohol Use: No  . Drug Use: No  . Sexual Activity: Yes    Birth Control/ Protection:  Surgical   Other Topics Concern  . Not on file   Social History Narrative   Patient is left handed.   Patient does not drink caffeine.      PHYSICAL EXAM  Filed Vitals:   04/14/15 0735  BP: 113/77  Pulse: 90  Height: 5\' 5"  (1.651 m)  Weight: 167 lb (75.751 kg)   Body mass index is 27.79 kg/(m^2).  Generalized: Well developed, in no acute distress   Neurological examination  Mentation: Alert oriented to time, place, history taking. Follows all commands speech and language fluent Cranial nerve II-XII: Pupils were equal round reactive to light. Extraocular movements were full, visual field were full on confrontational test. Facial sensation and strength were normal. Uvula tongue midline. Head turning and shoulder shrug  were normal and symmetric. Motor: The motor testing reveals 5 over 5 strength of all 4 extremities. Good symmetric motor tone is noted throughout.  Sensory: Sensory testing is intact to soft touch on all 4 extremities. No evidence of extinction is noted.  Coordination: Cerebellar testing reveals good finger-nose-finger and heel-to-shin bilaterally.  Gait and station: Gait is normal. Tandem gait is normal. Romberg is negative. No drift is seen.  Reflexes: Deep tendon reflexes are symmetric and normal bilaterally.    DIAGNOSTIC DATA (LABS, IMAGING, TESTING) - I reviewed patient records, labs, notes, testing and imaging myself where available.     ASSESSMENT AND PLAN 44 y.o. year old female  has a past medical history of Hypertension and Headache. here with:  1. Migraine headache  Overall the patient is doing well. Her migraine symptoms have improved on Topamax. She will continue taking Topamax 200 mg at bedtime. We will consider repeating MRI of the brain at the next visit. If her symptoms worsen or she develops new symptoms she was advised to let us know. She will follow-up in 4 months or sooner if needed.   Butch Penny, MSN, NP-C 04/14/2015, 7:33  AM [facility] [address], [address] [place], [place] [address] [phone]  Note: This document was prepared with digital dictation and possible smart phrase technology. Any transcriptional errors that result from this process are unintentional.

## 2015-04-14 NOTE — Progress Notes (Signed)
I have read the note, and I agree with the clinical assessment and plan.  WILLIS,CHARLES KEITH   

## 2015-05-01 ENCOUNTER — Ambulatory Visit: Payer: PRIVATE HEALTH INSURANCE | Admitting: Adult Health

## 2015-06-17 ENCOUNTER — Ambulatory Visit (INDEPENDENT_AMBULATORY_CARE_PROVIDER_SITE_OTHER): Payer: PRIVATE HEALTH INSURANCE | Admitting: Family Medicine

## 2015-06-17 VITALS — BP 120/80 | HR 87 | Temp 98.0°F | Resp 16 | Ht 65.0 in | Wt 160.0 lb

## 2015-06-17 DIAGNOSIS — R208 Other disturbances of skin sensation: Secondary | ICD-10-CM | POA: Diagnosis not present

## 2015-06-17 DIAGNOSIS — R51 Headache: Secondary | ICD-10-CM | POA: Diagnosis not present

## 2015-06-17 DIAGNOSIS — G249 Dystonia, unspecified: Secondary | ICD-10-CM | POA: Diagnosis not present

## 2015-06-17 DIAGNOSIS — R200 Anesthesia of skin: Secondary | ICD-10-CM

## 2015-06-17 DIAGNOSIS — R519 Headache, unspecified: Secondary | ICD-10-CM

## 2015-06-17 NOTE — Progress Notes (Signed)
   Subjective:    Patient ID: Carrie Buckley, female    DOB: October 21, 1971, 44 y.o.   MRN: [id number]  Chief Complaint  Patient presents with  . Extremity Weakness    right hand  . Fatigue  . Headache    left side   Medications, allergies, past medical history, surgical history, family history, social history and problem list reviewed and updated.  HPI  30 yof presents with above sx.   Sx started about 3 hrs pta with "funny" feeling in right hand. Felt like entire right hand was tensing up and all 5 fingers were clenching. This lasted for approx 1 hour before easing up. Felt like couldn't open water bottle as hand was clenched up. Started when she was typing. Has not returned. Has never felt this before. Denies assoc numbness, weakness, or tingling RUE. Denies similar sx RLE.   Also mentions left temporal HA which started about 2 hrs pta. Was 4/10 but has decreased to 1/10. This was assoc with mild numbness left perioral which is still present but has mostly resolved. No assoc vision changes. No vertigo.   Neuro hx:  Sept 2015 mri showed small chronic left cerebellar infarcts.  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  She sees neuro regularly for migraines and takes Topamax qhs. Feels that this HA may have been one of her migraines just mild.   Review of Systems See HPI     Objective:   Physical Exam  Constitutional: She is oriented to person, place, and time. She appears well-developed and well-nourished.  Non-toxic appearance. She does not have a sickly appearance. She does not appear ill. No distress.  BP 120/80 mmHg  Pulse 87  Temp(Src) 98 F (36.7 C) (Oral)  Resp 16  Ht  (1.651 m)  Wt 160 lb (72.576 kg)  BMI 26.63 kg/m2  SpO2 98%  LMP 06/06/2015   Eyes: Conjunctivae and EOM are normal. Pupils are equal, round, and reactive to light.  Neck: Normal  range of motion. No Brudzinski's sign noted.  Cardiovascular: Normal rate, regular rhythm and normal heart sounds.   Neurological: She is alert and oriented to person, place, and time. She has normal strength. No cranial nerve deficit or sensory deficit. She exhibits normal muscle tone. She displays a negative Romberg sign. Coordination and gait normal.  Normal rapid alternating movements, normal heel to shin.       Assessment & Plan:   Dystonia  Headache, unspecified headache type  Perioral numbness --right hand dystonia and perioral numbness have resolved, HA currently 1/10 --neuro exam benign --pt instructed to contact her neurologist tomo am to see them earlier than her Oct appt --ER if sx return for possible imaging  Donnajean Lopes, PA-C Physician Assistant-Certified [facility]  06/17/2015 8:50 PM  Hx and PE reviewed.  Agree with plan Elvina Sidle, MD

## 2015-06-17 NOTE — Patient Instructions (Signed)
Your exam was normal today which is reassuring.  As we are unsure at this time what caused your symptoms please be sure to contact your neurologist tomorrow and ask to be seen earlier than your October appointment so you can follow up with them on these symptoms.  If these symptoms return prior to seeing them be sure to go to the ER asap for further work up.

## 2015-08-17 ENCOUNTER — Ambulatory Visit (INDEPENDENT_AMBULATORY_CARE_PROVIDER_SITE_OTHER): Payer: PRIVATE HEALTH INSURANCE | Admitting: Adult Health

## 2015-08-17 ENCOUNTER — Encounter: Payer: Self-pay | Admitting: Adult Health

## 2015-08-17 VITALS — BP 116/75 | HR 84 | Resp 20 | Ht 65.0 in | Wt 155.0 lb

## 2015-08-17 DIAGNOSIS — G43009 Migraine without aura, not intractable, without status migrainosus: Secondary | ICD-10-CM | POA: Diagnosis not present

## 2015-08-17 DIAGNOSIS — R930 Abnormal findings on diagnostic imaging of skull and head, not elsewhere classified: Secondary | ICD-10-CM

## 2015-08-17 NOTE — Patient Instructions (Signed)
Continue Topamax We will recheck MRI and MRA of brain. If your symptoms worsen or you develop new symptoms please let us know.

## 2015-08-17 NOTE — Progress Notes (Signed)
I have read the note, and I agree with the clinical assessment and plan.  WILLIS,CHARLES KEITH   

## 2015-08-17 NOTE — Progress Notes (Signed)
PATIENT: Carrie Buckley DOB: 08/20/1971  REASON FOR VISIT: follow up- migraine, abormal MRI/MRA HISTORY FROM: patient  HISTORY OF PRESENT ILLNESS: Carrie Buckley is a 44 year old female with a history of migraine headaches and abnormal MRI of the brain. She returns today for follow-up. The patient continues taking Topamax 200 mg daily. She reports that her headaches have been well controlled. She states that her last headache was in August. She states this headache presented quite differently than what she has experienced in the past. She states that she did have a headache but it was not severe. The headache was located in the back of the head and in the temporal region. She states that she began to have a "weird sensation in the hands" and this presented with some difficulty opening a soda can. She also had a "weird" sensation in the right arm. She states the symptoms lasted a couple of hours. For that reason she went to urgent care. She states that her exam was normal and her symptoms subsided. She has continued taking aspirin daily. She follows up with her primary care regularly. Denies having high cholesterol. She does not smoke. She returns today for an evaluation.  HISTORY 04/14/15: Carrie Buckley is a 44 year old with a history of migraine headaches. She returns today for follow-up. The patient is currently taking Topamax 200 mg at bedtime. She reports that this is working well for her. She states that her symptoms have improved. She states that she has not had a severe migraine in several months. She states that she did have a mild headache one month ago. She states that every once in a while she'll have lightheadedness, feel off balance and dizziness. This is usually associated when her medication tends to wear off or weather related. The patient continues to have intermittent numbness and tingling in the hands and feet associated with Topamax. She denies any cognitive changes since starting  Topamax. She states that she feels that she is "sharper" since she started the Topamax. She denies any new medical issues. She returns today for evaluation  REVIEW OF SYSTEMS: Out of a complete 14 system review of symptoms, the patient complains only of the following symptoms, and all other reviewed systems are negative.  See history of present illness  ALLERGIES: No Known Allergies  HOME MEDICATIONS: Outpatient Prescriptions Prior to Visit  Medication Sig Dispense Refill  . aspirin 81 MG tablet Take 81 mg by mouth daily.    . cyanocobalamin 2000 MCG tablet Take 2,000 mcg by mouth daily.    . Multiple Vitamin (MULTIVITAMIN) tablet Take 1 tablet by mouth daily.    Marland Kitchen pyridoxine (RA VITAMIN B-6) 100 MG tablet Take 100 mg by mouth daily.    Marland Kitchen topiramate (TOPAMAX) 100 MG tablet Take 1 tablet (100 mg total) by mouth 2 (two) times daily. 60 tablet 5   No facility-administered medications prior to visit.    PAST MEDICAL HISTORY: Past Medical History  Diagnosis Date  . Hypertension   . Headache     PAST SURGICAL HISTORY: Past Surgical History  Procedure Laterality Date  . Tubal ligation    . Cesarean section    . Nasal sinus surgery    . Eye surgery      FAMILY HISTORY: Family History  Problem Relation Age of Onset  . Cancer Maternal Aunt     colon  . Hypertension Maternal Aunt   . Hypertension Maternal Uncle   . Hypertension Paternal Aunt   . Hypertension  Paternal Uncle   . Cancer Mother   . Migraines Neg Hx     SOCIAL HISTORY: Social History   Social History  . Marital Status: Married    Spouse Name: Peyton NajjarLarry  . Number of Children: 1  . Years of Education: N/A   Occupational History  . Not on file.   Social History Main Topics  . Smoking status: Never Smoker   . Smokeless tobacco: Never Used  . Alcohol Use: No  . Drug Use: No  . Sexual Activity: Yes    Birth Control/ Protection: Surgical   Other Topics Concern  . Not on file   Social History Narrative    Patient lives at home with her husband [name].   Patient works full time Audiological scientistaccounting.   Education patient is in college now.   Left handed.   Patient does not drink caffeine.      PHYSICAL EXAM  Filed Vitals:   08/17/15 0723  BP: 116/75  Pulse: 84  Resp: 20  Height: 5\' 5"  (1.651 m)  Weight: 155 lb (70.308 kg)   Body mass index is 25.79 kg/(m^2).  Generalized: Well developed, in no acute distress   Neurological examination  Mentation: Alert oriented to time, place, history taking. Follows all commands speech and language fluent Cranial nerve II-XII: Pupils were equal round reactive to light. Extraocular movements were full, visual field were full on confrontational test. Facial sensation and strength were normal. Uvula tongue midline. Head turning and shoulder shrug  were normal and symmetric. Motor: The motor testing reveals 5 over 5 strength of all 4 extremities. Good symmetric motor tone is noted throughout.  Sensory: Sensory testing is intact to soft touch on all 4 extremities. No evidence of extinction is noted.  Coordination: Cerebellar testing reveals good finger-nose-finger and heel-to-shin bilaterally.  Gait and station: Gait is normal. Tandem gait is normal. Romberg is negative. No drift is seen.  Reflexes: Deep tendon reflexes are symmetric and normal bilaterally.   DIAGNOSTIC DATA (LABS, IMAGING, TESTING) - I reviewed patient records, labs, notes, testing and imaging myself where available.  MRI BRAIN W Wo Contrast 07/21/14: IMPRESSION: 1. Small, chronic left cerebellar infarcts. 2. Minimal periventricular white matter T2 hyperintensity, nonspecific and not necessarily greater than expected for patient's age. 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 This may be a source of emboli causing the left cerebellar  infarction noted on prior MRI brain. No other significant vertebral or carotid stenosis identified.  Negative MRA head.  ASSESSMENT AND PLAN 44 y.o. year old female  has a past medical history of Hypertension and Headache. here with   1. Migraine headache 2. Abnormal MRI/MRA   The patient's headaches have been controlled with Topamax. The patient did have a headache in August that presented with different symptoms. In the past the patient has had an abnormal  MRI and MRA of the brain. We will repeat those tests today looking for any significant changes. Patient advised that if her symptoms worsen or she develops any new symptoms she should let us know. She will follow-up in 3-4 months or sooner if needed.   Butch PennyMegan Amesha Bailey, MSN, NP-C 08/17/2015, 8:09 AM [facility] [address], [address] [place], [place] [address] [phone]

## 2015-08-18 ENCOUNTER — Other Ambulatory Visit: Payer: Self-pay | Admitting: Neurology

## 2015-09-03 ENCOUNTER — Other Ambulatory Visit: Payer: PRIVATE HEALTH INSURANCE

## 2015-09-03 ENCOUNTER — Ambulatory Visit
Admission: RE | Admit: 2015-09-03 | Discharge: 2015-09-03 | Disposition: A | Discharge status: Home / Self Care | Payer: PRIVATE HEALTH INSURANCE | Source: Ambulatory Visit | Attending: Adult Health | Admitting: Adult Health

## 2015-09-03 DIAGNOSIS — R930 Abnormal findings on diagnostic imaging of skull and head, not elsewhere classified: Secondary | ICD-10-CM

## 2015-09-03 MED ORDER — GADOBENATE DIMEGLUMINE 529 MG/ML IV SOLN
13.0000 mL | Freq: Once | INTRAVENOUS | Status: AC | PRN
  Administered 2015-09-03: 13 mL via INTRAVENOUS

## 2015-09-07 ENCOUNTER — Telehealth: Payer: Self-pay

## 2015-09-07 NOTE — Telephone Encounter (Signed)
Spoke to patient. Gave MRI/MRA results. Patient verbalized understanding.

## 2015-09-07 NOTE — Telephone Encounter (Deleted)
-----   Message from Butch PennyMegan Millikan, NP sent at 09/07/2015  8:58 AM EST ----- MRA stable. Please call patient.

## 2015-09-07 NOTE — Telephone Encounter (Signed)
-----   Message from Butch PennyMegan Millikan, NP sent at 09/07/2015  8:57 AM EST ----- MRI/MRA stable since 2015. Please call patient.

## 2015-11-18 ENCOUNTER — Ambulatory Visit (INDEPENDENT_AMBULATORY_CARE_PROVIDER_SITE_OTHER): Payer: PRIVATE HEALTH INSURANCE | Admitting: Adult Health

## 2015-11-30 ENCOUNTER — Ambulatory Visit (INDEPENDENT_AMBULATORY_CARE_PROVIDER_SITE_OTHER): Payer: PRIVATE HEALTH INSURANCE | Admitting: Adult Health

## 2015-11-30 ENCOUNTER — Encounter: Payer: Self-pay | Admitting: Adult Health

## 2015-11-30 VITALS — BP 122/76 | HR 91 | Wt 155.0 lb

## 2015-11-30 DIAGNOSIS — R930 Abnormal findings on diagnostic imaging of skull and head, not elsewhere classified: Secondary | ICD-10-CM

## 2015-11-30 DIAGNOSIS — G43709 Chronic migraine without aura, not intractable, without status migrainosus: Secondary | ICD-10-CM | POA: Diagnosis not present

## 2015-11-30 MED ORDER — TOPIRAMATE 100 MG PO TABS: 100.0000 mg | ORAL_TABLET | Freq: Two times a day (BID) | ORAL | Status: DC

## 2015-11-30 NOTE — Patient Instructions (Signed)
Continue Topamax 200 mg daily If your symptoms worsen or you develop new symptoms please let us know.   

## 2015-11-30 NOTE — Progress Notes (Signed)
PATIENT: Carrie Buckley DOB: Jul 11, 1971  REASON FOR VISIT: follow up- headache. Abnormal MRI/MRA HISTORY FROM: patient  HISTORY OF PRESENT ILLNESS:  Carrie Buckley is a 45 year old  female with a history of migraine headache. she returns today for follow-up. She states that Topamax is controlling her headaches. She has continued taking 200 mg daily. She reports that she has maybe 1 headache a month. Her headaches normally present with lightheadedness. She may have photophobia and phonophobia as well as nausea but no vomiting. Her last headache was located in the  Left temporal region. In the past the patient did have a complaint of dizziness however that has resolved. Her last MRI and MRA of the head was negative. She denies any new neurological symptoms. She returns today for an evaluation.  HISTORY 08/17/15: Carrie Buckley is a 45 year old female with a history of migraine headaches and abnormal MRI of the brain. She returns today for follow-up. The patient continues taking Topamax 200 mg daily. She reports that her headaches have been well controlled. She states that her last headache was in August. She states this headache presented quite differently than what she has experienced in the past. She states that she did have a headache but it was not severe. The headache was located in the back of the head and in the temporal region. She states that she began to have a "weird sensation in the hands" and this presented with some difficulty opening a soda can. She also had a "weird" sensation in the right arm. She states the symptoms lasted a couple of hours. For that reason she went to urgent care. She states that her exam was normal and her symptoms subsided. She has continued taking aspirin daily. She follows up with her primary care regularly. Denies having high cholesterol. She does not smoke. She returns today for an evaluation.  REVIEW OF SYSTEMS: Out of a complete 14 system review of  symptoms, the patient complains only of the following symptoms, and all other reviewed systems are negative.   See history of present illness  ALLERGIES: No Known Allergies  HOME MEDICATIONS: Outpatient Prescriptions Prior to Visit  Medication Sig Dispense Refill  . aspirin 81 MG tablet Take 81 mg by mouth daily.    . cyanocobalamin 2000 MCG tablet Take 2,000 mcg by mouth daily.    . Multiple Vitamin (MULTIVITAMIN) tablet Take 1 tablet by mouth daily.    Marland Kitchen pyridoxine (RA VITAMIN B-6) 100 MG tablet Take 100 mg by mouth daily.    Marland Kitchen topiramate (TOPAMAX) 100 MG tablet TAKE 1 TABLET TWICE DAILY. 60 tablet 3   No facility-administered medications prior to visit.    PAST MEDICAL HISTORY: Past Medical History  Diagnosis Date  . Hypertension   . Headache     PAST SURGICAL HISTORY: Past Surgical History  Procedure Laterality Date  . Tubal ligation    . Cesarean section    . Nasal sinus surgery    . Eye surgery      FAMILY HISTORY: Family History  Problem Relation Age of Onset  . Cancer Maternal Aunt     colon  . Hypertension Maternal Aunt   . Hypertension Maternal Uncle   . Hypertension Paternal Aunt   . Hypertension Paternal Uncle   . Cancer Mother   . Migraines Neg Hx     SOCIAL HISTORY: Social History   Social History  . Marital Status: Married    Spouse Name: Peyton Najjar  . Number of Children: 1  .  Years of Education: N/A   Occupational History  . Not on file.   Social History Main Topics  . Smoking status: Never Smoker   . Smokeless tobacco: Never Used  . Alcohol Use: No  . Drug Use: No  . Sexual Activity: Yes    Birth Control/ Protection: Surgical   Other Topics Concern  . Not on file   Social History Narrative   Patient lives at home with her husband Peyton Najjar).   Patient works full time Audiological scientist.   Education patient is in college now.   Left handed.   Patient does not drink caffeine.      PHYSICAL EXAM  Filed Vitals:   11/30/15 1419  BP:  122/76  Pulse: 91   Body mass index is 25.79 kg/(m^2).  Generalized: Well developed, in no acute distress   Neurological examination  Mentation: Alert oriented to time, place, history taking. Follows all commands speech and language fluent Cranial nerve II-XII: Pupils were equal round reactive to light. Extraocular movements were full, visual field were full on confrontational test. Facial sensation and strength were normal. Uvula tongue midline. Head turning and shoulder shrug  were normal and symmetric. Motor: The motor testing reveals 5 over 5 strength of all 4 extremities. Good symmetric motor tone is noted throughout.  Sensory: Sensory testing is intact to soft touch on all 4 extremities. No evidence of extinction is noted.  Coordination: Cerebellar testing reveals good finger-nose-finger and heel-to-shin bilaterally.  Gait and station: Gait is normal. Tandem gait is normal. Romberg is negative. No drift is seen.  Reflexes: Deep tendon reflexes are symmetric and normal bilaterally.   DIAGNOSTIC DATA (LABS, IMAGING, TESTING) - I reviewed patient records, labs, notes, testing and imaging myself where available.      ASSESSMENT AND PLAN 45 y.o. year old female  has a past medical history of Hypertension and Headache. here with:   1. Migraine headaches 2. Abnormal MRI of the head   Overall the patient is doing well. She will remain on Topamax 200 mg daily. I have sent for a refill. Patient denies any new neurological symptoms. She is advised that if her headache frequency or severity increases she should let us know. She will follow-up in 6 months with Dr. Anne Hahn.     Butch Penny, MSN, NP-C 11/30/2015, 1:59 PM [facility] [address] [place], [place] [address] [phone]

## 2015-11-30 NOTE — Progress Notes (Signed)
I have read the note, and I agree with the clinical assessment and plan.  WILLIS,CHARLES KEITH   

## 2015-12-29 ENCOUNTER — Other Ambulatory Visit: Payer: Self-pay

## 2015-12-29 DIAGNOSIS — Z1231 Encounter for screening mammogram for malignant neoplasm of breast: Secondary | ICD-10-CM

## 2016-01-01 ENCOUNTER — Ambulatory Visit
Admission: RE | Admit: 2016-01-01 | Discharge: 2016-01-01 | Disposition: A | Discharge status: Home / Self Care | Payer: PRIVATE HEALTH INSURANCE | Source: Ambulatory Visit

## 2016-01-01 DIAGNOSIS — Z1231 Encounter for screening mammogram for malignant neoplasm of breast: Secondary | ICD-10-CM

## 2016-05-30 ENCOUNTER — Encounter: Payer: Self-pay | Admitting: Neurology

## 2016-05-30 ENCOUNTER — Ambulatory Visit (INDEPENDENT_AMBULATORY_CARE_PROVIDER_SITE_OTHER): Payer: PRIVATE HEALTH INSURANCE | Admitting: Neurology

## 2016-05-30 VITALS — BP 114/87 | HR 90 | Ht 65.0 in | Wt 138.0 lb

## 2016-05-30 DIAGNOSIS — G43709 Chronic migraine without aura, not intractable, without status migrainosus: Secondary | ICD-10-CM

## 2016-05-30 MED ORDER — HYDROCODONE-ACETAMINOPHEN 5-325 MG PO TABS: 1.0000 | ORAL_TABLET | Freq: Four times a day (QID) | ORAL | 0 refills | Status: DC | PRN

## 2016-05-30 MED ORDER — RIZATRIPTAN BENZOATE 10 MG PO TBDP: 10.0000 mg | ORAL_TABLET | Freq: Three times a day (TID) | ORAL | 3 refills | Status: DC | PRN

## 2016-05-30 NOTE — Patient Instructions (Addendum)

## 2016-05-30 NOTE — Progress Notes (Signed)
Reason for visit: Migraine headache  Carrie Buckley is an 45 y.o. female  History of present illness:  Carrie Buckley is a 45 year old left-handed white female with a history of migraine headache. The patient is having 1 or 2 headaches every 2 weeks which may last up to 4 hours. The patient may note some dizziness, photophobia, nausea with the headache. The headache rarely impacts her ability to function, sometimes she has to lay down. She is taking over-the-counter medication for the headache, in the past she has noted that hydrocodone may be effective. She has tried Imitrex previously that has not been helpful. MRI of the brain showed a chronic ischemic lesion in the left cerebellum, the patient has been noted two years ago to have an abnormality in the proximal left vertebral artery, a MRA of the head done within the last year has shown good flow in the vertebral arteries bilaterally. The patient indicates that weather changes, certain foods, and perfumes may bring on headache. She returns for an evaluation. She remains on Topamax taking 100 mg twice daily, she is tolerating this well.  Past Medical History:  Diagnosis Date  . Headache   . Hypertension     Past Surgical History:  Procedure Laterality Date  . CESAREAN SECTION    . EYE SURGERY    . NASAL SINUS SURGERY    . SKIN GRAFT Left    jaw  . TUBAL LIGATION      Family History  Problem Relation Age of Onset  . Cancer Maternal Aunt     colon  . Hypertension Maternal Aunt   . Hypertension Maternal Uncle   . Hypertension Paternal Aunt   . Hypertension Paternal Uncle   . Cancer Mother   . Migraines Neg Hx     Social history:  reports that she has never smoked. She has never used smokeless tobacco. She reports that she does not drink alcohol or use drugs.   No Known Allergies  Medications:  Prior to Admission medications   Medication Sig Start Date End Date Taking? Authorizing Provider  aspirin 81 MG tablet Take  81 mg by mouth daily.    Historical Provider, MD  cyanocobalamin 2000 MCG tablet Take 2,000 mcg by mouth daily.    Historical Provider, MD  Multiple Vitamin (MULTIVITAMIN) tablet Take 1 tablet by mouth daily.    Historical Provider, MD  pyridoxine (RA VITAMIN B-6) 100 MG tablet Take 100 mg by mouth daily.    Historical Provider, MD  topiramate (TOPAMAX) 100 MG tablet Take 1 tablet (100 mg total) by mouth 2 (two) times daily. 11/30/15   Butch Penny, NP    ROS:  Out of a complete 14 system review of symptoms, the patient complains only of the following symptoms, and all other reviewed systems are negative.  Headache, dizziness  There were no vitals taken for this visit.  Physical Exam  General: The patient is alert and cooperative at the time of the examination.  Skin: No significant peripheral edema is noted.   Neurologic Exam  Mental status: The patient is alert and oriented x 3 at the time of the examination. The patient has apparent normal recent and remote memory, with an apparently normal attention span and concentration ability.   Cranial nerves: Facial symmetry is present. Speech is normal, no aphasia or dysarthria is noted. Extraocular movements are full, but on gaze, there is slight exotropia of the left eye. Visual fields are full.  Motor: The patient has  good strength in all 4 extremities.  Sensory examination: Soft touch sensation is symmetric on the face, arms, and legs.  Coordination: The patient has good finger-nose-finger and heel-to-shin bilaterally.  Gait and station: The patient has a normal gait. Tandem gait is normal. Romberg is negative. No drift is seen.  Reflexes: Deep tendon reflexes are symmetric.   Assessment/Plan:  1. Migraine headache  2. Left cerebellar infarct, chronic, left vertebral artery lesion  The patient is doing well at this time. She will be given a trial on the Maxalt to see if this is helpful, if not, she may use hydrocodone. A  prescription was given for both. Recent MRA of the head shows normal intracranial flow. The patient will follow-up in one year, sooner if needed.  Marlan Palau. Keith Kharon Hixon MD 05/30/2016 7:57 AM  [facility] [address] [place], [place] [id number]  Phone [phone] Fax [phone]

## 2016-09-07 ENCOUNTER — Telehealth: Payer: Self-pay | Admitting: Neurology

## 2016-09-07 NOTE — Telephone Encounter (Signed)
Patient reports new symptoms of charley horse in right foot, can barely lift foot since, "almost like dropped foot" that started last Thursday. States she has numbness in this foot along with numbness right arm, states that morning she felt funny in head, felt off a little bit, states she normally runs everyday and doesn't even know that she can do that with her foot like this. Patient works at [employer], across the street from [facility], skyped nurse Victorino DikeJennifer who took the call.

## 2016-09-07 NOTE — Telephone Encounter (Signed)
Spoke w/ Aundra MilletMegan NP who recommended that pt be seen by MD due to new symptoms. Called pt back and suggested that she call PCP for advice or go to Urgent Care for worsening/unrelieved discomfort. However, [facility]. Appt scheduled for tomorrow w/ Dr. Anne HahnWillis.

## 2016-09-07 NOTE — Telephone Encounter (Signed)
Spoke to patient who asked to be seen by Dr. Anne HahnWillis or Aundra MilletMegan NP some time today. Dr. Anne HahnWillis currently has no appts available.

## 2016-09-08 ENCOUNTER — Ambulatory Visit (INDEPENDENT_AMBULATORY_CARE_PROVIDER_SITE_OTHER): Payer: PRIVATE HEALTH INSURANCE | Admitting: Neurology

## 2016-09-08 ENCOUNTER — Telehealth: Payer: Self-pay | Admitting: Neurology

## 2016-09-08 ENCOUNTER — Ambulatory Visit
Admission: RE | Admit: 2016-09-08 | Discharge: 2016-09-08 | Disposition: A | Discharge status: Home / Self Care | Payer: PRIVATE HEALTH INSURANCE | Source: Ambulatory Visit | Attending: Neurology | Admitting: Neurology

## 2016-09-08 ENCOUNTER — Encounter: Payer: Self-pay | Admitting: Neurology

## 2016-09-08 VITALS — BP 129/93 | HR 90 | Resp 20 | Ht 65.0 in | Wt 132.0 lb

## 2016-09-08 DIAGNOSIS — G43709 Chronic migraine without aura, not intractable, without status migrainosus: Secondary | ICD-10-CM

## 2016-09-08 DIAGNOSIS — R29898 Other symptoms and signs involving the musculoskeletal system: Secondary | ICD-10-CM

## 2016-09-08 DIAGNOSIS — M21371 Foot drop, right foot: Secondary | ICD-10-CM

## 2016-09-08 NOTE — Patient Instructions (Signed)
   We will do MRI brain.

## 2016-09-08 NOTE — Progress Notes (Signed)
Reason for visit: Right leg weakness  Carrie NordmannMelissa K Buckley is a 45 y.o. female  History of present illness:  [name] is a 45 year old left-handed white female with a history of migraine headaches. The patient has been taking Topamax, she may have Maxalt or hydrocodone to take for her headache. The patient reports that she began having new symptoms one week ago. The patient was in a classroom, and she noted that her vision started becoming blurred, she had difficulty seeing the chalkboard. The patient denied any headache around that time. The patient noted that when she got up to leave that her right leg seemed to be numb. The patient has reported some numbness in the anterolateral aspect of the right leg below the knee and on the top of the foot. The patient began noting that her foot was weak, she was having difficulty pulling up her toes and her right foot. That evening, the patient had cramps in the right foot while sleeping. Over the last 2 days, she has had some tingling sensations in her right arm. She denies any numbness of the face. Her vision has cleared. She denies any problems with understanding or generating language. She has had a general sense of fatigue since onset of symptoms. Once again she denies any headache. She has not had any chest pain or palpitations of the heart. The patient is not on any estrogen based birth control pills, she did take a course of progesterone within the last 2 weeks. The patient comes to this office for an evaluation. She denies any back pain or pain down the right leg. She believes that she may have been crossing her legs while sitting in the classroom.  Past Medical History:  Diagnosis Date  . Headache   . Hypertension     Past Surgical History:  Procedure Laterality Date  . CESAREAN SECTION    . EYE SURGERY    . NASAL SINUS SURGERY    . SKIN GRAFT Left    jaw  . TUBAL LIGATION      Family History  Problem Relation Age of Onset  .  Cancer Mother   . Cancer Maternal Aunt     colon  . Hypertension Maternal Aunt   . Hypertension Maternal Uncle   . Hypertension Paternal Aunt   . Hypertension Paternal Uncle   . Migraines Neg Hx     Social history:  reports that she has never smoked. She has never used smokeless tobacco. She reports that she does not drink alcohol or use drugs.  Medications:  Prior to Admission medications   Medication Sig Start Date End Date Taking? Authorizing Provider  aspirin 81 MG tablet Take 81 mg by mouth daily.   Yes Historical Provider, MD  cyanocobalamin 2000 MCG tablet Take 2,000 mcg by mouth daily.   Yes Historical Provider, MD  HYDROcodone-acetaminophen (NORCO/VICODIN) 5-325 MG tablet Take 1 tablet by mouth every 6 (six) hours as needed for moderate pain. 05/30/16  Yes York Spanielharles K Willis, MD  Multiple Vitamin (MULTIVITAMIN) tablet Take 1 tablet by mouth daily.   Yes Historical Provider, MD  pyridoxine (RA VITAMIN B-6) 100 MG tablet Take 100 mg by mouth daily.   Yes Historical Provider, MD  rizatriptan (MAXALT-MLT) 10 MG disintegrating tablet Take 1 tablet (10 mg total) by mouth 3 (three) times daily as needed for migraine. 05/30/16  Yes York Spanielharles K Willis, MD  topiramate (TOPAMAX) 100 MG tablet Take 1 tablet (100 mg total) by mouth 2 (two)  times daily. 11/30/15  Yes Butch PennyMegan Millikan, NP     No Known Allergies  ROS:  Out of a complete 14 system review of symptoms, the patient complains only of the following symptoms, and all other reviewed systems are negative.  Right leg weakness  Blood pressure (!) 129/93, pulse 90, resp. rate 20, height 5\' 5"  (1.651 m), weight 132 lb (59.9 kg).  Physical Exam  General: The patient is alert and cooperative at the time of the examination.  Eyes: Pupils are equal, round, and reactive to light. Discs are flat bilaterally.  Neck: The neck is supple, no carotid bruits are noted.  Respiratory: The respiratory examination is clear.  Cardiovascular: The  cardiovascular examination reveals a regular rate and rhythm, no obvious murmurs or rubs are noted.  Skin: Extremities are without significant edema.  Neurologic Exam  Mental status: The patient is alert and oriented x 3 at the time of the examination. The patient has apparent normal recent and remote memory, with an apparently normal attention span and concentration ability.  Cranial nerves: Facial symmetry is present. There is good sensation of the face to pinprick and soft touch bilaterally. The strength of the facial muscles and the muscles to head turning and shoulder shrug are normal bilaterally. Speech is well enunciated, no aphasia or dysarthria is noted. Extraocular movements are full. On primary gaze, there is exotropia of the left eye. Visual fields are full. The tongue is midline, and the patient has symmetric elevation of the soft palate. No obvious hearing deficits are noted.  Motor: The motor testing reveals 5 over 5 strength of all 4 extremities, with exception of weakness with dorsiflexion of the right foot, and with eversion of the right foot not inversion. Good symmetric motor tone is noted throughout.  Sensory: Sensory testing is intact to pinprick, soft touch, vibration sensation, and position sense on all 4 extremities, with exception of some decreased pinprick sensation and vibration sensation on the right foot. No evidence of extinction is noted.  Coordination: Cerebellar testing reveals good finger-nose-finger and heel-to-shin bilaterally.  Gait and station: Gait is normal. Tandem gait is normal. Romberg is negative. No drift is seen. The patient is able to walk on the toes bilaterally, unable to walk on heel on the right foot.  Reflexes: Deep tendon reflexes are symmetric and normal bilaterally, with exception that the left biceps reflex may be slightly elevated relative to the right. Toes are downgoing bilaterally.   Assessment/Plan:  1. Right leg weakness  The  patient has reported some right leg weakness associated with some blurring of vision, she has had some right arm numbness at times as well. 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 The patient will be sent for MRI of the brain, if this is unremarkable, we may consider EMG and nerve conduction study in the future. The patient is to stop crossing her legs, she is not to use Maxalt until the stroke workup is complete. The patient is on a baby aspirin daily. MRI of the brain is to be done as soon as possible.  Marlan Palau. Keith Willis MD 09/08/2016 7:44 AM  [facility] [address] [place], [place] [address]  Phone [phone] Fax [phone]

## 2016-09-08 NOTE — Telephone Encounter (Signed)
I called patient. No acute stroke has been noted. The patient will be set up for EMG and nerve conduction study evaluation, nerve conductions on both legs and EMG on the right leg.   MRI brain 09/08/16:  IMPRESSION: No change.  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  Old focus of deep white matter T2 and FLAIR signal in the left hemisphere adjacent to the posterior body of the lateral ventricle as seen previously .

## 2016-12-13 ENCOUNTER — Other Ambulatory Visit: Payer: Self-pay | Admitting: Adult Health

## 2017-01-07 ENCOUNTER — Encounter (HOSPITAL_COMMUNITY): Payer: Self-pay | Admitting: Emergency Medicine

## 2017-01-07 ENCOUNTER — Ambulatory Visit (HOSPITAL_COMMUNITY)
Admission: EM | Admit: 2017-01-07 | Discharge: 2017-01-07 | Disposition: A | Discharge status: Home / Self Care | Payer: PRIVATE HEALTH INSURANCE | Attending: Family Medicine | Admitting: Family Medicine

## 2017-01-07 DIAGNOSIS — L03311 Cellulitis of abdominal wall: Secondary | ICD-10-CM | POA: Diagnosis not present

## 2017-01-07 DIAGNOSIS — J0100 Acute maxillary sinusitis, unspecified: Secondary | ICD-10-CM | POA: Diagnosis not present

## 2017-01-07 MED ORDER — AMOXICILLIN-POT CLAVULANATE 875-125 MG PO TABS: 1.0000 | ORAL_TABLET | Freq: Two times a day (BID) | ORAL | 0 refills | Status: DC

## 2017-01-07 MED ORDER — TRIAMCINOLONE ACETONIDE 0.1 % EX CREA: 1.0000 "application " | TOPICAL_CREAM | Freq: Two times a day (BID) | CUTANEOUS | 0 refills | Status: DC

## 2017-01-07 NOTE — ED Provider Notes (Signed)
[facility]    CSN: [id number] Arrival date & time: 01/07/17  1939     History   Chief Complaint Chief Complaint  Patient presents with  . Rash    HPI Carrie Buckley is a 46 y.o. female.   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 Patient has had shingles in the past on her left face but this does not feel like shingles. Her husband has had a rash recently which was diagnosis poison ivy, but patient has not been out in the woods or exposed to plants that might of been like poison ivy. She says the rash burns and itches somewhat. She's tried hydrocortisone cream but hasn't done any good, but in fact led to some burning.  Patient's had congestion now for 2 weeks.      Past Medical History:  Diagnosis Date  . Headache   . Hypertension     Patient Active Problem List   Diagnosis Date Noted  . Right leg weakness 09/08/2016  . Dizziness and giddiness 07/31/2014  . Migraine 11/09/2012    Past Surgical History:  Procedure Laterality Date  . CESAREAN SECTION    . EYE SURGERY    . NASAL SINUS SURGERY    . SKIN GRAFT Left    jaw  . TUBAL LIGATION      OB History    Gravida Para Term Preterm AB Living   1 1       1    SAB TAB Ectopic Multiple Live Births                   Home Medications    Prior to Admission medications   Medication Sig Start Date End Date Taking? Authorizing Provider  amoxicillin-clavulanate (AUGMENTIN) 875-125 MG tablet Take 1 tablet by mouth every 12 (twelve) hours. 01/07/17   Elvina Sidle, MD  aspirin 81 MG tablet Take 81 mg by mouth daily.    Historical Provider, MD  cyanocobalamin 2000 MCG tablet Take 2,000 mcg by mouth daily.    Historical Provider, MD  Multiple Vitamin (MULTIVITAMIN) tablet Take 1 tablet by mouth daily.    Historical Provider, MD  pyridoxine (RA VITAMIN B-6) 100 MG tablet Take 100 mg by  mouth daily.    Historical Provider, MD  rizatriptan (MAXALT-MLT) 10 MG disintegrating tablet Take 1 tablet (10 mg total) by mouth 3 (three) times daily as needed for migraine. 05/30/16   York Spaniel, MD  topiramate (TOPAMAX) 100 MG tablet TAKE 1 TABLET TWICE DAILY. 12/14/16   York Spaniel, MD  triamcinolone cream (KENALOG) 0.1 % Apply 1 application topically 2 (two) times daily. 01/07/17   Elvina Sidle, MD    Family History Family History  Problem Relation Age of Onset  . Cancer Mother   . Cancer Maternal Aunt     colon  . Hypertension Maternal Aunt   . Hypertension Maternal Uncle   . Hypertension Paternal Aunt   . Hypertension Paternal Uncle   . Migraines Neg Hx     Social History Social History  Substance Use Topics  . Smoking status: Never Smoker  . Smokeless tobacco: Never Used  . Alcohol use No     Allergies   Patient has no known allergies.   Review of Systems Review of Systems  Constitutional: Negative.   HENT: Positive for congestion.   Respiratory:  Negative.   Cardiovascular: Negative.   Gastrointestinal: Negative.   Genitourinary: Negative.   Skin: Positive for rash.  Neurological: Negative.      Physical Exam Triage Vital Signs ED Triage Vitals [01/07/17 2032]  Enc Vitals Group     BP 120/77     Pulse Rate 70     Resp 18     Temp 97.5 F (36.4 C)     Temp Source Oral     SpO2 100 %     Weight      Height      Head Circumference      Peak Flow      Pain Score 4     Pain Loc      Pain Edu?      Excl. in GC?    No data found.   Updated Vital Signs BP 120/77 (BP Location: Right Arm)   Pulse 70   Temp 97.5 F (36.4 C) (Oral)   Resp 18   SpO2 100%    Physical Exam  Constitutional: She is oriented to person, place, and time. She appears well-developed and well-nourished.  HENT:  Head: Normocephalic.  Right Ear: External ear normal.  Left Ear: External ear normal.  Mouth/Throat: Oropharynx is clear and moist.  Eyes:  Conjunctivae are normal. Pupils are equal, round, and reactive to light.  Left esotropia  Neck: Normal range of motion. Neck supple.  Musculoskeletal: Normal range of motion.  Neurological: She is alert and oriented to person, place, and time.  Skin: Skin is warm.  Right flank erythematous raised rash which is irregular and confluent with several areas of induration and scaly surface.  Psychiatric: She has a normal mood and affect.  Nursing note and vitals reviewed.    UC Treatments / Results  Labs (all labs ordered are listed, but only abnormal results are displayed) Labs Reviewed - No data to display  EKG  EKG Interpretation None       Radiology No results found.  Procedures Procedures (including critical care time)  Medications Ordered in UC Medications - No data to display   Initial Impression / Assessment and Plan / UC Course  I have reviewed the triage vital signs and the nursing notes.  Pertinent labs & imaging results that were available during my care of the patient were reviewed by me and considered in my medical decision making (see chart for details).     Final Clinical Impressions(s) / UC Diagnoses   Final diagnoses:  Cellulitis of abdominal wall  Acute maxillary sinusitis, recurrence not specified    New Prescriptions New Prescriptions   AMOXICILLIN-CLAVULANATE (AUGMENTIN) 875-125 MG TABLET    Take 1 tablet by mouth every 12 (twelve) hours.   TRIAMCINOLONE CREAM (KENALOG) 0.1 %    Apply 1 application topically 2 (two) times daily.     Elvina SidleKurt Anneke Cundy, MD 01/07/17 2104

## 2017-01-07 NOTE — ED Triage Notes (Signed)
Rash on back for 2 weeks  Patient thinks she has a sinus infection:  Pain in forehead, tight forehead, nasal congestion x 2 weeks

## 2017-05-31 ENCOUNTER — Ambulatory Visit (INDEPENDENT_AMBULATORY_CARE_PROVIDER_SITE_OTHER): Payer: PRIVATE HEALTH INSURANCE | Admitting: Neurology

## 2017-05-31 ENCOUNTER — Encounter: Payer: Self-pay | Admitting: Neurology

## 2017-05-31 VITALS — BP 102/68 | HR 82 | Ht 65.0 in | Wt 134.0 lb

## 2017-05-31 DIAGNOSIS — G43709 Chronic migraine without aura, not intractable, without status migrainosus: Secondary | ICD-10-CM

## 2017-05-31 DIAGNOSIS — R202 Paresthesia of skin: Secondary | ICD-10-CM

## 2017-05-31 MED ORDER — HYDROCODONE-ACETAMINOPHEN 5-325 MG PO TABS: 1.0000 | ORAL_TABLET | Freq: Four times a day (QID) | ORAL | 0 refills | Status: DC | PRN

## 2017-05-31 MED ORDER — TOPIRAMATE 100 MG PO TABS: 250.0000 mg | ORAL_TABLET | Freq: Every day | ORAL | 3 refills | Status: DC

## 2017-05-31 NOTE — Patient Instructions (Signed)
   We will go up on the Topamax to 250 mg  At night, We will get EMG and NCV study to look at nerve function of the arms and legs.

## 2017-05-31 NOTE — Progress Notes (Signed)
Reason for visit: Paresthesias, migraine headache  Carrie RobinsonsMelissa K Carrie Buckley is an 46 y.o. female  History of present illness:  [name] is a 46 year old left-handed white female with a history of migraine headache. She indicates that she gets frequent episodes of pressure sensations in the head that occur with weather changes or with damp conditions. She may have 2 overt migraine headaches a year. She takes hydrocodone for this, she has Maxalt to take as well. The patient has had a right foot drop on the last visit, she never underwent EMG evaluation but she stopped crossing her legs and the right leg symptoms improved. She is now having some left arm and hand numbness and numbness in the toes of the left foot. Previously she had right arm numbness. She has undergone MRI of the brain that was done in November 2017 that did not show evidence of an acute stroke. The patient denies any significant neck pain or low back pain or pain down arms or legs. She has not had any significant balance issues. She returns to this office for an evaluation.  Past Medical History:  Diagnosis Date  . Headache   . Hypertension     Past Surgical History:  Procedure Laterality Date  . CESAREAN SECTION    . EYE SURGERY    . NASAL SINUS SURGERY    . SKIN GRAFT Left    jaw  . TUBAL LIGATION      Family History  Problem Relation Age of Onset  . Cancer Mother   . Cancer Maternal Aunt        colon  . Hypertension Maternal Aunt   . Hypertension Maternal Uncle   . Hypertension Paternal Aunt   . Hypertension Paternal Uncle   . Migraines Neg Hx     Social history:  reports that she has never smoked. She has never used smokeless tobacco. She reports that she does not drink alcohol or use drugs.   No Known Allergies  Medications:  Prior to Admission medications   Medication Sig Start Date End Date Taking? Authorizing Provider  amoxicillin-clavulanate (AUGMENTIN) 875-125 MG tablet Take 1 tablet by mouth  every 12 (twelve) hours. 01/07/17  Yes Elvina SidleLauenstein, Kurt, MD  aspirin 81 MG tablet Take 81 mg by mouth daily.   Yes [provider]  cyanocobalamin 2000 MCG tablet Take 2,000 mcg by mouth daily.   Yes [provider]  Multiple Vitamin (MULTIVITAMIN) tablet Take 1 tablet by mouth daily.   Yes [provider]  pyridoxine (RA VITAMIN B-6) 100 MG tablet Take 100 mg by mouth daily.   Yes [provider]  rizatriptan (MAXALT-MLT) 10 MG disintegrating tablet Take 1 tablet (10 mg total) by mouth 3 (three) times daily as needed for migraine. 05/30/16  Yes York SpanielWillis, Ryley Teater K, MD  topiramate (TOPAMAX) 100 MG tablet TAKE 1 TABLET TWICE DAILY. Patient taking differently: TAKE 200mg  at bedtime 12/14/16  Yes York SpanielWillis, Cynthea Zachman K, MD    ROS:  Out of a complete 14 system review of symptoms, the patient complains only of the following symptoms, and all other reviewed systems are negative.  Headache Numbness  Blood pressure 102/68, pulse 82, height 5\' 5"  (1.651 m), weight 134 lb (60.8 kg), SpO2 98 %.  Physical Exam  General: The patient is alert and cooperative at the time of the examination.  Skin: No significant peripheral edema is noted.   Neurologic Exam  Mental status: The patient is alert and oriented x 3 at the  time of the examination. The patient has apparent normal recent and remote memory, with an apparently normal attention span and concentration ability.   Cranial nerves: Facial symmetry is present. Speech is normal, no aphasia or dysarthria is noted. Extraocular movements are full. Visual fields are full. On primary gaze, there is slight exotropia of the left eye.  Motor: The patient has good strength in all 4 extremities.  Sensory examination: Soft touch sensation is symmetric on the face, arms, and legs.  Coordination: The patient has good finger-nose-finger and heel-to-shin bilaterally.  Gait and station: The patient has a normal gait. Tandem gait is  normal. Romberg is negative. No drift is seen. The patient is able to walk on heels and the toes bilaterally.  Reflexes: Deep tendon reflexes are symmetric.   MRI brain 09/08/16:  IMPRESSION: No change.  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  Old focus of deep white matter T2 and FLAIR signal in the left hemisphere adjacent to the posterior body of the lateral ventricle as seen previously .  * MRI scan images were reviewed online. I agree with the written report.    Assessment/Plan:  1. Migraine headache  2. Left arm and foot numbness  The patient will undergo nerve conduction studies on both legs and left arm, EMG on the left leg. The patient will be increased on Topamax taking 250 mg at night. She will be given a prescription for hydrocodone to take if needed, she may also take Maxalt for her headache. She will follow-up in 6 months for revisit.  Marlan Palau MD 05/31/2017 8:15 AM  [facility] [address] [place], [place] [address]  Phone [phone] Fax [phone]

## 2017-12-04 ENCOUNTER — Encounter (INDEPENDENT_AMBULATORY_CARE_PROVIDER_SITE_OTHER): Payer: Self-pay

## 2017-12-04 ENCOUNTER — Encounter: Payer: Self-pay | Admitting: Neurology

## 2017-12-04 ENCOUNTER — Ambulatory Visit: Payer: PRIVATE HEALTH INSURANCE | Admitting: Neurology

## 2017-12-04 VITALS — BP 120/82 | HR 89 | Wt 135.5 lb

## 2017-12-04 DIAGNOSIS — R42 Dizziness and giddiness: Secondary | ICD-10-CM

## 2017-12-04 DIAGNOSIS — G43709 Chronic migraine without aura, not intractable, without status migrainosus: Secondary | ICD-10-CM

## 2017-12-04 MED ORDER — PROPRANOLOL HCL 20 MG PO TABS: 20.0000 mg | ORAL_TABLET | Freq: Two times a day (BID) | ORAL | 3 refills | Status: DC

## 2017-12-04 MED ORDER — HYDROCODONE-ACETAMINOPHEN 5-325 MG PO TABS: 1.0000 | ORAL_TABLET | Freq: Four times a day (QID) | ORAL | 0 refills | Status: DC | PRN

## 2017-12-04 NOTE — Progress Notes (Signed)
Reason for visit: Migraine headache  Carrie Buckley is an 47 y.o. female  History of present illness:  Carrie Buckley is a 47 year old left-handed white female with a history of migraine headache.  The patient is also had some episodes of foot and hand tingling, she has been set up for EMG and nerve conduction study evaluation on 2 occasions but never wished to have the study scheduled.  The patient continues to have some tingling in the hands, the foot numbness and tingling has improved.  She no longer has a left-sided foot drop.  The patient is mainly concerned today about episodes of feeling spacey in the head, tingling in the back of the head and a pressure sensation in the back of the head.  She has problems with feeling tight in the throat as if she cannot swallow.  The patient claims that the tingling and spacey feelings predated the use of Topamax.  The patient is on 250 mg of Topamax at night.  She tolerates this fairly well.  She has taken Maxalt only on 2 occasions and felt that it did not help.  The patient takes a half of a hydrocodone tablet if needed for the migraine which is very helpful.  Weather changes will activate her migraine.  She returns to this office for an evaluation.  Past Medical History:  Diagnosis Date  . Headache   . Hypertension     Past Surgical History:  Procedure Laterality Date  . CESAREAN SECTION    . EYE SURGERY    . NASAL SINUS SURGERY    . SKIN GRAFT Left    jaw  . TUBAL LIGATION      Family History  Problem Relation Age of Onset  . Cancer Mother   . Cancer Maternal Aunt        colon  . Hypertension Maternal Aunt   . Hypertension Maternal Uncle   . Hypertension Paternal Aunt   . Hypertension Paternal Uncle   . Migraines Neg Hx     Social history:  reports that  has never smoked. she has never used smokeless tobacco. She reports that she does not drink alcohol or use drugs.   No Known Allergies  Medications:  Prior to Admission  medications   Medication Sig Start Date End Date Taking? Authorizing Provider  aspirin 81 MG tablet Take 81 mg by mouth daily.   Yes [provider]  cyanocobalamin 2000 MCG tablet Take 2,000 mcg by mouth daily.   Yes [provider]  HYDROcodone-acetaminophen (NORCO/VICODIN) 5-325 MG tablet Take 1 tablet by mouth every 6 (six) hours as needed for moderate pain. 05/31/17  Yes York SpanielWillis, Moses Odoherty K, MD  Multiple Vitamin (MULTIVITAMIN) tablet Take 1 tablet by mouth daily.   Yes [provider]  pyridoxine (RA VITAMIN B-6) 100 MG tablet Take 100 mg by mouth daily.   Yes [provider]  rizatriptan (MAXALT-MLT) 10 MG disintegrating tablet Take 1 tablet (10 mg total) by mouth 3 (three) times daily as needed for migraine. 05/30/16  Yes York SpanielWillis, [place] Scheiderer K, MD  topiramate (TOPAMAX) 100 MG tablet Take 2.5 tablets (250 mg total) by mouth at bedtime. 05/31/17  Yes York SpanielWillis, Kaipo Ardis K, MD    ROS:  Out of a complete 14 system review of symptoms, the patient complains only of the following symptoms, and all other reviewed systems are negative.  Spacey feeling Headache, dizziness  Blood pressure 120/82, pulse 89, weight 135 lb 8 oz (61.5 kg).  Physical  Exam  General: The patient is alert and cooperative at the time of the examination.  Skin: No significant peripheral edema is noted.   Neurologic Exam  Mental status: The patient is alert and oriented x 3 at the time of the examination. The patient has apparent normal recent and remote memory, with an apparently normal attention span and concentration ability.   Cranial nerves: Facial symmetry is present. Speech is normal, no aphasia or dysarthria is noted. Extraocular movements are full.  With superior gaze there is divergence of the eyes.  Visual fields are full.  Motor: The patient has good strength in all 4 extremities.  Sensory examination: Soft touch sensation is symmetric on the face, arms, and legs.  Coordination:  The patient has good finger-nose-finger and heel-to-shin bilaterally.  Gait and station: The patient has a normal gait. Tandem gait is normal. Romberg is negative. No drift is seen.  Reflexes: Deep tendon reflexes are symmetric.   Assessment/Plan:  1.  Migraine headache  2.  Paresthesias  The episodes of feeling spacey may be related to migraine.  The patient is on a fairly high dose of Topamax, she does not believe the medication has resulted in these symptoms.  The patient will be placed on low-dose propranolol 20 mg twice daily, she will call for any dose adjustments.  She will follow-up in 5 months.  A prescription was also written for hydrocodone.  Marlan Palau MD 12/04/2017 8:29 AM  [facility] [address] [place], [place] [address]  Phone [phone] Fax [phone]

## 2017-12-04 NOTE — Patient Instructions (Signed)
   Start propranolol for the headache and episodes of dizziness and pressure.  Inderal (propranolol) is a blood pressure medication that is commonly used for migraine headaches. This is a type of beta blocker. The most common side effects include low heart rate, dizziness, fatigue, and increased depression. This medication may worsen asthma. If you believe that you are having side effects on this medication, please contact our office.

## 2018-01-10 IMAGING — MR MR HEAD W/O CM
11 series · 48 of 48 positions shown · non-contrast
Comparison: 09/03/2015.  07/18/2014.

CLINICAL DATA: Possible stroke.  Numbness of the right foot.

EXAM:
MRI HEAD WITHOUT CONTRAST
TECHNIQUE: Multiplanar, multiecho pulse sequences of the brain and surrounding
structures were obtained without intravenous contrast.

[Series 2: T1 · sagittal · 5.0mm · 0.45mm/px · 1 of 21 slices shown]
[im 1/21]
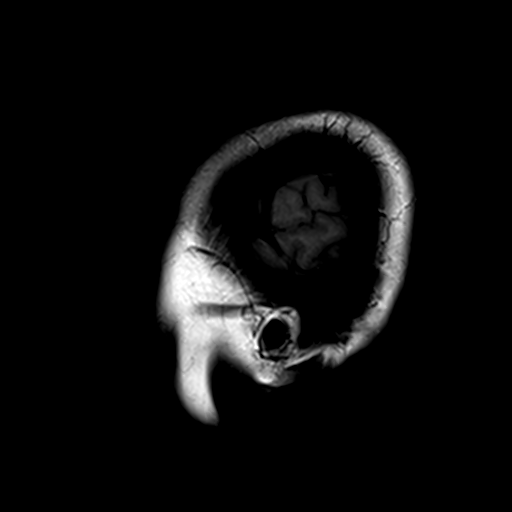

[Series 3: DWI · axial · 3.0mm · 1.80mm/px · z∈[-66,+81]mm · 9 of 100 slices shown (1 of 4)]
[im 1/100]
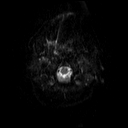
[im 13/100]
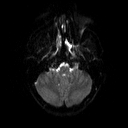
[im 25/100]
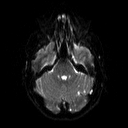
[im 38/100]
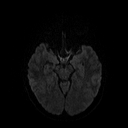
[im 50/100]
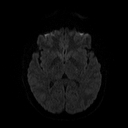
[im 62/100]
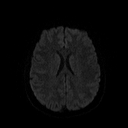
[im 75/100]
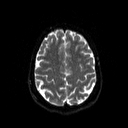
[im 87/100]
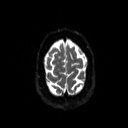
[im 100/100]
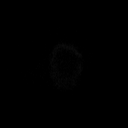

[Series 4: DWI · axial · 3.0mm · 1.80mm/px · z∈[-66,+81]mm · 4 of 48 slices shown (2 of 4)]
[im 1/48]
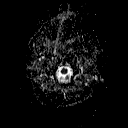
[im 16/48]
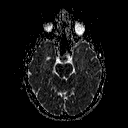
[im 32/48]
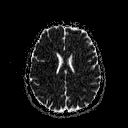
[im 48/48]
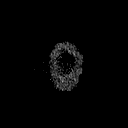

[Series 5: DWI · coronal · 5.0mm · 1.80mm/px · 5 of 61 slices shown (3 of 4)]
[im 1/61]
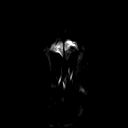
[im 16/61]
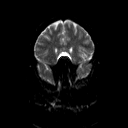
[im 31/61]
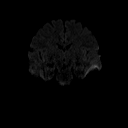
[im 46/61]
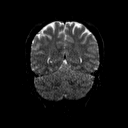
[im 61/61]
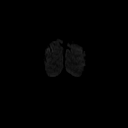

[Series 6: DWI · coronal · 5.0mm · 1.80mm/px · 3 of 31 slices shown (4 of 4)]
[im 1/31]
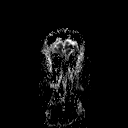
[im 16/31]
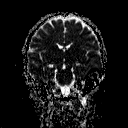
[im 31/31]
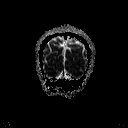

[Series 7: T2 · axial · 5.0mm · 0.51mm/px · z∈[-60,+82]mm · 2 of 22 slices shown (1 of 2)]
[im 1/22]
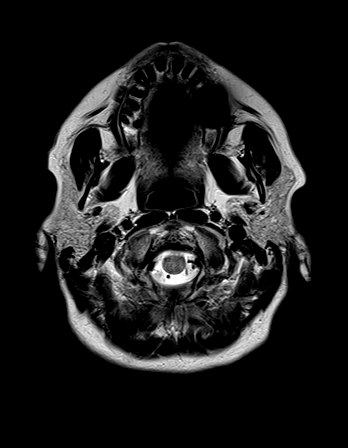
[im 22/22]
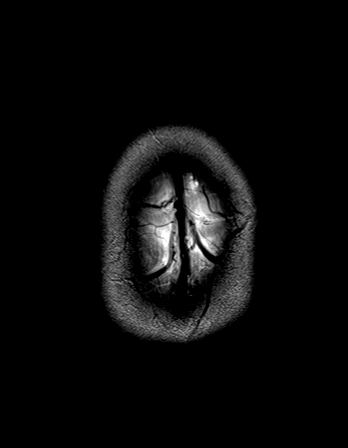

[Series 8: FLAIR · axial · 5.0mm · 0.45mm/px · z∈[-61,+81]mm · 2 of 22 slices shown]
[im 1/22]
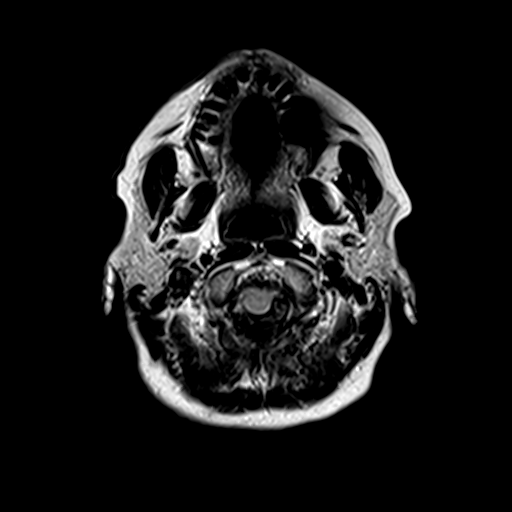
[im 22/22]
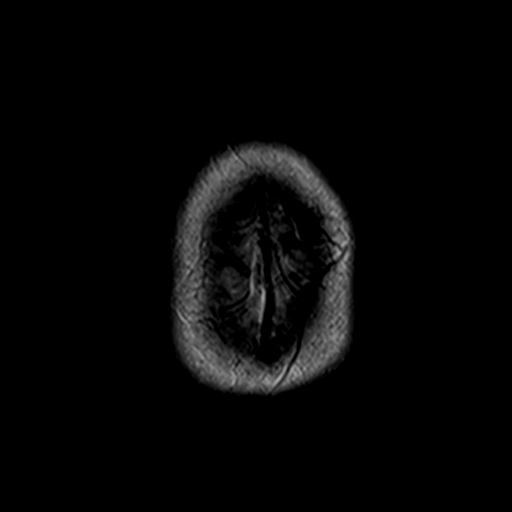

[Series 9: mip_images(sw) · axial · 16.0mm · 0.90mm/px · z∈[-62,+82]mm · 6 of 73 slices shown]
[im 1/73]
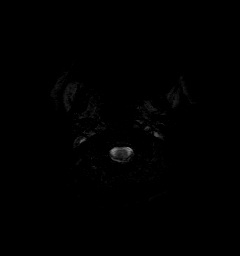
[im 15/73]
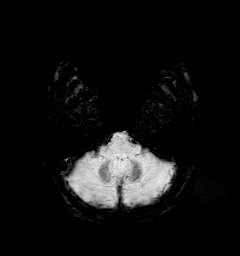
[im 29/73]
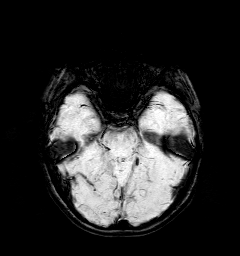
[im 44/73]
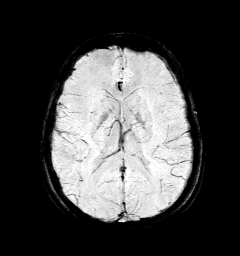
[im 58/73]
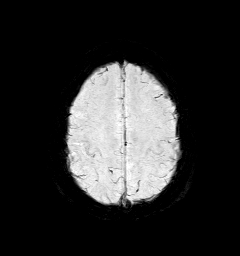
[im 73/73]
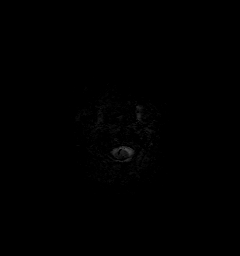

[Series 10: swi_images · axial · 2.0mm · 0.90mm/px · z∈[-69,+89]mm · 7 of 80 slices shown]
[im 1/80]
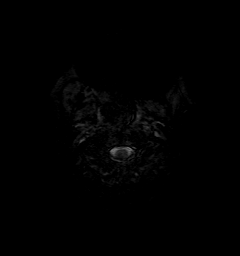
[im 14/80]
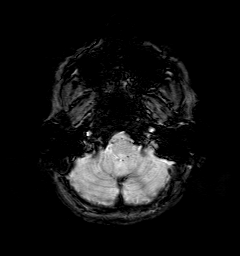
[im 27/80]
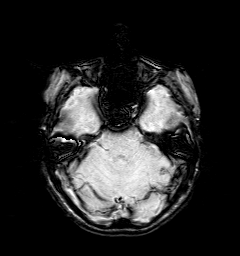
[im 40/80]
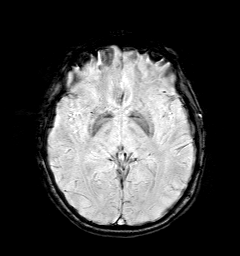
[im 53/80]
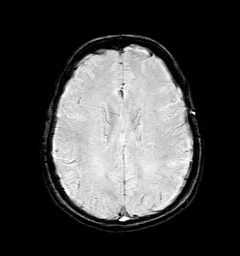
[im 66/80]
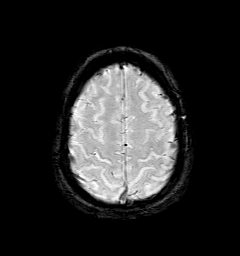
[im 80/80]
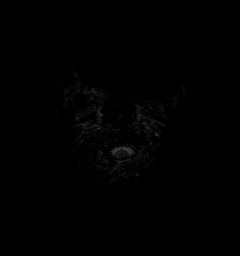

[Series 11: t1_mpr_tra · axial · 2.0mm · 0.45mm/px · z∈[-68,+90]mm · 7 of 80 slices shown]
[im 1/80]
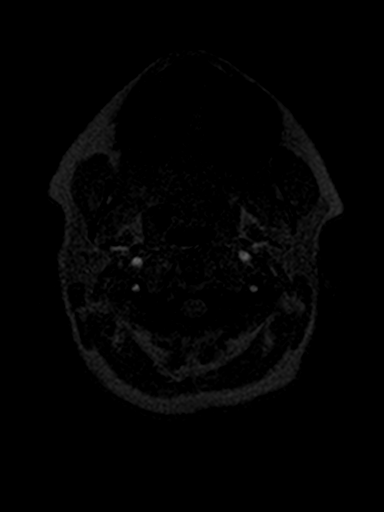
[im 14/80]
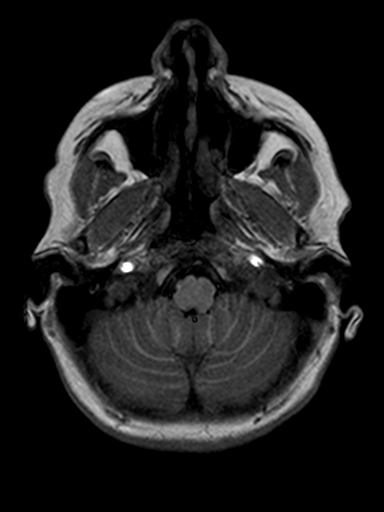
[im 27/80]
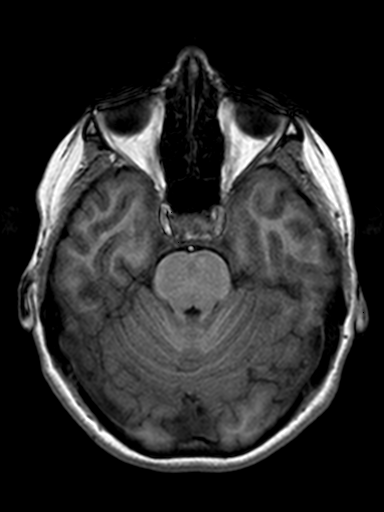
[im 40/80]
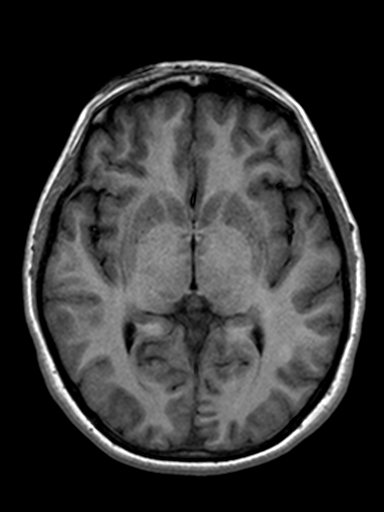
[im 53/80]
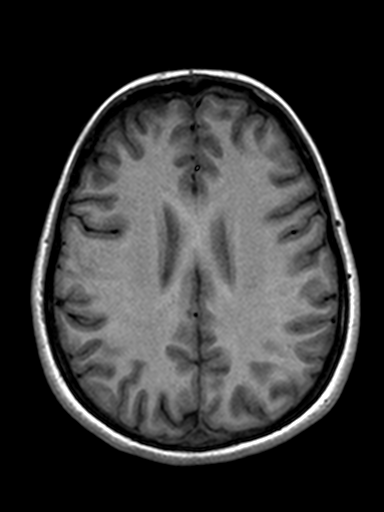
[im 66/80]
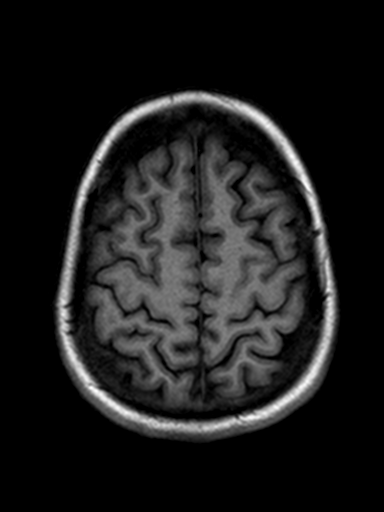
[im 80/80]
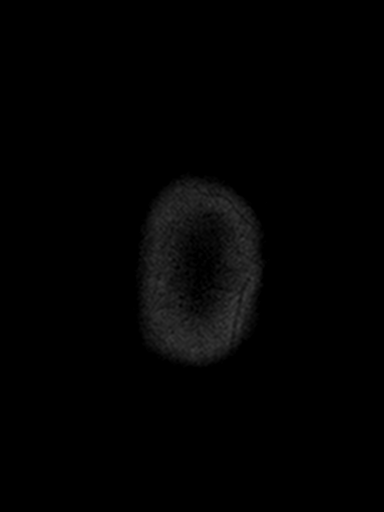

[Series 12: T2 · coronal · 5.0mm · 0.45mm/px · 2 of 23 slices shown (2 of 2)]
[im 1/23]
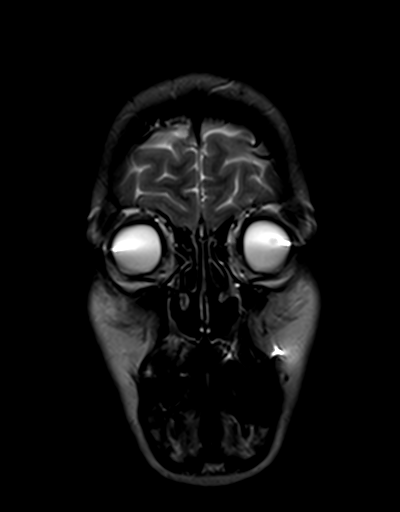
[im 23/23]
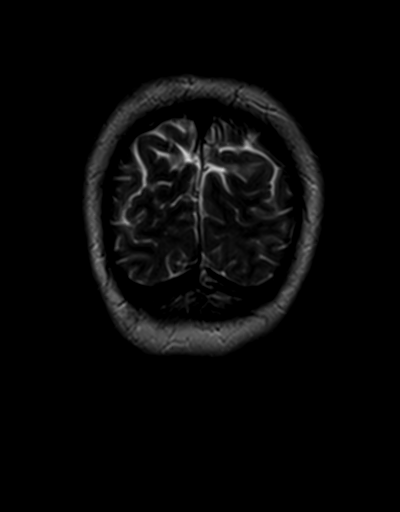

[48 of 48 positions shown; findings below may reference images not displayed]

FINDINGS: Brain: Diffusion imaging does not show any acute or subacute
infarction. The brainstem is normal. There is an old infarction
within the left cerebellum laterally as seen before. Cerebral
hemispheres are normal except for a single small focus of T2 and
FLAIR signal within the deep white matter adjacent to the posterior
body of the left lateral ventricle. No cortical insult. No mass
lesion, hemorrhage, hydrocephalus or extra-axial collection. No
pituitary mass.

Vascular: Major vessels at base of the brain show flow.

Skull and upper cervical spine: Normal

Sinuses/Orbits: Clear/normal

Other: None
IMPRESSION: No change.

Cluster of old infarctions in the lateral left cerebellar hemisphere
as seen previously.

Old focus of deep white matter T2 and FLAIR signal in the left
hemisphere adjacent to the posterior body of the lateral ventricle
as seen previously .

## 2018-06-04 ENCOUNTER — Other Ambulatory Visit: Payer: Self-pay | Admitting: Neurology

## 2018-06-12 ENCOUNTER — Ambulatory Visit: Payer: PRIVATE HEALTH INSURANCE | Admitting: Neurology

## 2018-06-12 ENCOUNTER — Telehealth: Payer: Self-pay | Admitting: Neurology

## 2018-06-12 NOTE — Telephone Encounter (Signed)
This patient did not show for a revisit appointment today. 

## 2018-06-13 ENCOUNTER — Encounter: Payer: Self-pay | Admitting: Neurology

## 2018-07-05 ENCOUNTER — Telehealth: Payer: Self-pay | Admitting: Neurology

## 2018-07-05 MED ORDER — TOPIRAMATE 100 MG PO TABS: ORAL_TABLET | ORAL | 0 refills | Status: DC

## 2018-07-05 NOTE — Addendum Note (Signed)
Addended by: Hillis RangeKING, Laelynn Blizzard L on: 07/05/2018 12:34 PM   Modules accepted: Orders
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
## 2018-07-05 NOTE — Telephone Encounter (Signed)
Called pt and scheduled sooner appt for 08/03/18 at 12pm, check in 1130am. Cx appt previously made for 10/25/18.  Advised we will send in ref[facility] top[facility]. She verbalized understanding and appreciation.

## 2018-08-03 ENCOUNTER — Ambulatory Visit: Payer: PRIVATE HEALTH INSURANCE | Admitting: Neurology

## 2018-08-03 ENCOUNTER — Encounter: Payer: Self-pay | Admitting: Neurology

## 2018-08-03 VITALS — BP 115/84 | HR 81 | Ht 65.0 in | Wt 126.0 lb

## 2018-08-03 DIAGNOSIS — G43709 Chronic migraine without aura, not intractable, without status migrainosus: Secondary | ICD-10-CM

## 2018-08-03 MED ORDER — HYDROCODONE-ACETAMINOPHEN 5-325 MG PO TABS: 1.0000 | ORAL_TABLET | Freq: Four times a day (QID) | ORAL | 0 refills | Status: DC | PRN

## 2018-08-03 MED ORDER — TOPIRAMATE 100 MG PO TABS: ORAL_TABLET | ORAL | 3 refills | Status: DC

## 2018-08-03 MED ORDER — VENLAFAXINE HCL ER 37.5 MG PO CP24: ORAL_CAPSULE | ORAL | 3 refills | Status: DC

## 2018-08-03 NOTE — Progress Notes (Signed)
Reason for visit: Migraine headache  Carrie Buckley is an 47 y.o. female  History of present illness:  Carrie Buckley is a 47 year old left-handed white female with a history of intractable migraine headache.  The patient is averaging 2 migraine headaches a month, but she more commonly will have episodes of feeling pressure sensation and slight dizziness that oftentimes is initiated by weather changes.  The patient is on 250 mg of Topamax at night, she was given a trial on propranolol but could not tolerate the medication.  The patient takes Maxalt as needed.  The patient returns to the office today for an evaluation.  Past Medical History:  Diagnosis Date  . Headache   . Hypertension     Past Surgical History:  Procedure Laterality Date  . CESAREAN SECTION    . EYE SURGERY    . NASAL SINUS SURGERY    . SKIN GRAFT Left    jaw  . TUBAL LIGATION      Family History  Problem Relation Age of Onset  . Cancer Mother   . Cancer Maternal Aunt        colon  . Hypertension Maternal Aunt   . Hypertension Maternal Uncle   . Hypertension Paternal Aunt   . Hypertension Paternal Uncle   . Migraines Neg Hx     Social history:  reports that she has never smoked. She has never used smokeless tobacco. She reports that she does not drink alcohol or use drugs.   No Known Allergies  Medications:  Prior to Admission medications   Medication Sig Start Date End Date Taking? Authorizing Provider  aspirin 81 MG tablet Take 81 mg by mouth daily.   Yes [provider]  cyanocobalamin 2000 MCG tablet Take 2,000 mcg by mouth daily.   Yes [provider]  HYDROcodone-acetaminophen (NORCO/VICODIN) 5-325 MG tablet Take 1 tablet by mouth every 6 (six) hours as needed for moderate pain. 12/04/17  Yes York Spaniel, MD  Multiple Vitamin (MULTIVITAMIN) tablet Take 1 tablet by mouth daily.   Yes [provider]  pyridoxine (RA VITAMIN B-6) 100 MG tablet Take 100 mg by  mouth daily.   Yes [provider]  rizatriptan (MAXALT-MLT) 10 MG disintegrating tablet Take 1 tablet (10 mg total) by mouth 3 (three) times daily as needed for migraine. 05/30/16  Yes York Spaniel, MD  topiramate (TOPAMAX) 100 MG tablet TAKE 2.5 TABLETS BY MOUTH AT BEDTIME. 07/05/18  Yes York Spaniel, MD    ROS:  Out of a complete 14 system review of symptoms, the patient complains only of the following symptoms, and all other reviewed systems are negative.  Migraine headache  Blood pressure 115/84, pulse 81, height 5\' 5"  (1.651 m), weight 126 lb (57.2 kg).  Physical Exam  General: The patient is alert and cooperative at the time of the examination.  Skin: No significant peripheral edema is noted.   Neurologic Exam  Mental status: The patient is alert and oriented x 3 at the time of the examination. The patient has apparent normal recent and remote memory, with an apparently normal attention span and concentration ability.   Cranial nerves: Facial symmetry is present. Speech is normal, no aphasia or dysarthria is noted. Extraocular movements are full, with exception that there is incomplete medial deviation of the left eye with rightward gaze.  On primary gaze, there is exotropia of the left eye. Visual fields are full.  Motor: The patient has good strength in  all 4 extremities.  Sensory examination: Soft touch sensation is symmetric on the face, arms, and legs.  Coordination: The patient has good finger-nose-finger and heel-to-shin bilaterally.  Gait and station: The patient has a normal gait. Tandem gait is normal. Romberg is negative. No drift is seen.  Reflexes: Deep tendon reflexes are symmetric.   Assessment/Plan:  1.  Migraine headache  The patient will continue her Topamax dosing, she will be placed on Effexor taking 37.5 mg daily for 2 weeks and go to 75 mg daily.  She will call for any dose adjustments.  She will follow-up here in about 6  months.  Marlan Palau MD 08/03/2018 11:52 AM  [facility] [address] [place], [place] [address]  Phone [phone] Fax [phone]

## 2018-08-03 NOTE — Patient Instructions (Signed)
Start effexor 37.5 mg tablets daily for 2 weeks, then take 2 a day.  Cal for any dose adjustments.

## 2018-10-25 ENCOUNTER — Ambulatory Visit: Payer: PRIVATE HEALTH INSURANCE | Admitting: Neurology

## 2019-02-11 ENCOUNTER — Telehealth: Payer: Self-pay | Admitting: Neurology

## 2019-02-11 NOTE — Telephone Encounter (Signed)
Due to current COVID 19 pandemic, our office is severely reducing in office visits for at least the next 2 weeks, in order to minimize the risk to our patients and healthcare providers. Pt understands that although there may be some limitations with this type of visit, we will take all precautions to reduce any security or privacy concerns.  Pt understands that this will be treated like an in office visit and we will file with pt's insurance, and there may be a patient responsible charge related to this service. Pt's email is [email or web address]. Pt understands that the [employer] webex software must be downloaded and operational on the device pt plans to use for the visit.

## 2019-02-12 NOTE — Telephone Encounter (Signed)
Requested a call back from the pt to complete pre charting for video visit for 02/13/19. Office # and hours provided.

## 2019-02-13 ENCOUNTER — Ambulatory Visit (INDEPENDENT_AMBULATORY_CARE_PROVIDER_SITE_OTHER): Payer: BC Managed Care – PPO | Admitting: Neurology

## 2019-02-13 ENCOUNTER — Encounter: Payer: Self-pay | Admitting: Neurology

## 2019-02-13 ENCOUNTER — Other Ambulatory Visit: Payer: Self-pay

## 2019-02-13 DIAGNOSIS — G43709 Chronic migraine without aura, not intractable, without status migrainosus: Secondary | ICD-10-CM

## 2019-02-13 MED ORDER — SUMATRIPTAN 20 MG/ACT NA SOLN: 20.0000 mg | Freq: Two times a day (BID) | NASAL | 3 refills | Status: DC | PRN

## 2019-02-13 MED ORDER — PREGABALIN 50 MG PO CAPS: ORAL_CAPSULE | ORAL | 3 refills | Status: DC

## 2019-02-13 NOTE — Progress Notes (Signed)
     Virtual Visit via Video Note  I connected with Carrie Buckley on 02/13/19 at  8:00 AM EDT by a video enabled telemedicine application and verified that I am speaking with the correct person using two identifiers.   I discussed the limitations of evaluation and management by telemedicine and the availability of in person appointments. The patient expressed understanding and agreed to proceed.  The patient was at home, physician in office.  History of Present Illness: Carrie Buckley is a 48 year old left-handed white female with a history of intractable migraine headache.  She is still averaging about 2 headaches a month, she will take hydrocodone and lie down and try to get over the headache.  The next day she may have a "hangover" from the headache.  She will also have almost daily pressure sensations in the head that seem to worsen during times when there are weather changes.  When the pressure gets severe, the patient may feel off balance.  Around 8 or 9 PM every day, the pressure sensation suddenly goes away.  The patient reports that she continues the Topamax taking 250 mg daily.  She could not tolerate Effexor as it made her feel badly.  She has been on propranolol in the past and did not tolerate this.  The patient denies any significant neck stiffness or neck pain with a headache.  She continues to work, occasionally she has to stop work because of the headache.  In the past, Maxalt has not been very effective for the headache.   Observations/Objective: On the WebEx evaluation, the patient is alert and cooperative at the time examination.  Speech is well enunciated, not aphasic or dysarthric.  Extraocular moods are full with exception that with superior gaze, there is exotropia of the left eye.  The patient has good finger-nose-finger and heel shin bilaterally.  Gait is normal.  Tandem gait normal.  Romberg is negative.  Range of movement of the cervical spine is full.  Assessment  and Plan: 1.  Intractable migraine headache  The patient may be a good candidate for Aimovig or Ajovy, but she does not wish to use the medication because of the fact that these are injectable drugs.  We will try Lyrica in addition to the Topamax, starting at 50 mg in the evening for 2 weeks then go to 50 mg twice daily.  The patient will be placed on Imitrex nasal spray to see if this is more effective than the Maxalt oral tablets.  The patient will follow-up in 6 months, sooner if needed.  She will call for any dose adjustments of her medication.  She will continue the Topamax at the current dose.  Follow Up Instructions: 57-month follow-up, may see nurse practitioner.   I discussed the assessment and treatment plan with the patient. The patient was provided an opportunity to ask questions and all were answered. The patient agreed with the plan and demonstrated an understanding of the instructions.   The patient was advised to call back or seek an in-person evaluation if the symptoms worsen or if the condition fails to improve as anticipated.  I provided 23 minutes of non-face-to-face time during this encounter.   York Spaniel, MD

## 2019-04-04 ENCOUNTER — Other Ambulatory Visit: Payer: Self-pay | Admitting: Neurology

## 2019-07-26 ENCOUNTER — Telehealth: Payer: Self-pay | Admitting: Neurology

## 2019-07-26 ENCOUNTER — Other Ambulatory Visit: Payer: Self-pay | Admitting: Neurology

## 2019-07-26 NOTE — Telephone Encounter (Signed)
Pt is needing a refill on her topiramate (TOPAMAX) 100 MG tablet sent in to the [facility]

## 2019-07-29 NOTE — Telephone Encounter (Signed)
Refill sent.

## 2019-08-18 NOTE — Progress Notes (Signed)
PATIENT: Carrie NordmannMelissa K Buckley DOB: 11/07/1970  REASON FOR VISIT: follow up HISTORY FROM: patient  HISTORY OF PRESENT ILLNESS: Today 08/19/19  Carrie Buckley is a 48 year old female with history of migraine headaches.  She remains on Topamax 250 mg at bedtime, recently she has been taking 300 mg at bedtime.  She tried Lyrica, but it made her feel funny. She has been unable to tolerate Effexor or propanolol in the past.  She may take Imitrex nasal spray, or hydrocodone for a prolonged headache.  She reports that she may have 4-5 mild to moderate headaches a month.  She may have 1 severe headache every 2 months.  The Imitrex nasal spray has been helpful, she will take half tablet of hydrocodone for prolonged headache.  She indicates her overall health has been well.  Weather change is a trigger for her headaches.  She is working from home, but has not missed any days of work.  She may have nausea with her headaches.  She did have a severe headache yesterday, mild lingering headache today.  Her 46 year old daughter lives with her, recovering from a knee injury, having knee surgery this week.  She presents today for follow-up unaccompanied.  HISTORY 02/13/2019 Dr. Anne Buckley: Carrie CaulMelissa Buckley is a 48 year old left-handed white female with a history of intractable migraine headache.  She is still averaging about 2 headaches a month, she will take hydrocodone and lie down and try to get over the headache.  The next day she may have a "hangover" from the headache.  She will also have almost daily pressure sensations in the head that seem to worsen during times when there are weather changes.  When the pressure gets severe, the patient may feel off balance.  Around 8 or 9 PM every day, the pressure sensation suddenly goes away.  The patient reports that she continues the Topamax taking 250 mg daily.  She could not tolerate Effexor as it made her feel badly.  She has been on propranolol in the past and did not tolerate  this.  The patient denies any significant neck stiffness or neck pain with a headache.  She continues to work, occasionally she has to stop work because of the headache.  In the past, Maxalt has not been very effective for the headache.   REVIEW OF SYSTEMS: Out of a complete 14 system review of symptoms, the patient complains only of the following symptoms, and all other reviewed systems are negative.  Headache  ALLERGIES: No Known Allergies  HOME MEDICATIONS: Outpatient Medications Prior to Visit  Medication Sig Dispense Refill  . aspirin 81 MG tablet Take 81 mg by mouth daily.    Marland Kitchen. HYDROcodone-acetaminophen (NORCO/VICODIN) 5-325 MG tablet TAKE ONE TABLET EVERY 6 HOURS AS NEEDED FOR MODERATE PAIN. 30 tablet 0  . Multiple Vitamin (MULTIVITAMIN) tablet Take 1 tablet by mouth daily.    . cyanocobalamin 2000 MCG tablet Take 2,000 mcg by mouth daily.    . pregabalin (LYRICA) 50 MG capsule 1 capsule at night for 2 weeks and then take 1 capsule twice daily 60 capsule 3  . pyridoxine (RA VITAMIN B-6) 100 MG tablet Take 100 mg by mouth daily.    . SUMAtriptan (IMITREX) 20 MG/ACT nasal spray Place 1 spray (20 mg total) into the nose 2 (two) times daily as needed for migraine or headache. 1 Inhaler 3  . topiramate (TOPAMAX) 100 MG tablet TAKE 2.5 TABLETS BY MOUTH AT BEDTIME. 75 tablet 0   No facility-administered medications prior  to visit.     PAST MEDICAL HISTORY: Past Medical History:  Diagnosis Date  . Headache   . Hypertension     PAST SURGICAL HISTORY: Past Surgical History:  Procedure Laterality Date  . CESAREAN SECTION    . EYE SURGERY    . NASAL SINUS SURGERY    . SKIN GRAFT Left    jaw  . TUBAL LIGATION      FAMILY HISTORY: Family History  Problem Relation Age of Onset  . Cancer Mother   . Cancer Maternal Aunt        colon  . Hypertension Maternal Aunt   . Hypertension Maternal Uncle   . Hypertension Paternal Aunt   . Hypertension Paternal Uncle   . Migraines Neg  Hx     SOCIAL HISTORY: Social History   Socioeconomic History  . Marital status: Married    Spouse name: Carrie Buckley  . Number of children: 1  . Years of education: Not on file  . Highest education level: Not on file  Occupational History  . Not on file  Social Needs  . Financial resource strain: Not on file  . Food insecurity    Worry: Not on file    Inability: Not on file  . Transportation needs    Medical: Not on file    Non-medical: Not on file  Tobacco Use  . Smoking status: Never Smoker  . Smokeless tobacco: Never Used  Substance and Sexual Activity  . Alcohol use: No    Alcohol/week: 0.0 standard drinks  . Drug use: No  . Sexual activity: Yes    Birth control/protection: Surgical  Lifestyle  . Physical activity    Days per week: Not on file    Minutes per session: Not on file  . Stress: Not on file  Relationships  . Social Musician on phone: Not on file    Gets together: Not on file    Attends religious service: Not on file    Active member of club or organization: Not on file    Attends meetings of clubs or organizations: Not on file    Relationship status: Not on file  . Intimate partner violence    Fear of current or ex partner: Not on file    Emotionally abused: Not on file    Physically abused: Not on file    Forced sexual activity: Not on file  Other Topics Concern  . Not on file  Social History Narrative   Patient lives at home with her husband Carrie Buckley).   Patient works full time Audiological scientist.   Education patient is in college now.   Left handed.   Patient does not drink caffeine.   PHYSICAL EXAM  Vitals:   08/19/19 0809  BP: 118/86  Pulse: 80  Temp: (!) 97.3 F (36.3 C)  Weight: 147 lb (66.7 kg)  Height: 5\' 5"  (1.651 m)   Body mass index is 24.46 kg/m.  Generalized: Well developed, in no acute distress, well-appearing  Neurological examination  Mentation: Alert oriented to time, place, history taking. Follows all commands  speech and language fluent Cranial nerve II-XII: Pupils were equal round reactive to light. Extraocular movements were full, visual field were full on confrontational test. Facial sensation and strength were normal.Head turning and shoulder shrug  were normal and symmetric. Motor: The motor testing reveals 5 over 5 strength of all 4 extremities. Good symmetric motor tone is noted throughout.  Sensory: Sensory testing is intact to soft  touch on all 4 extremities. No evidence of extinction is noted.  Coordination: Cerebellar testing reveals good finger-nose-finger and heel-to-shin bilaterally.  Gait and station: Gait is normal. Tandem gait is normal. Romberg is negative. No drift is seen.  Reflexes: Deep tendon reflexes are symmetric and normal bilaterally.   DIAGNOSTIC DATA (LABS, IMAGING, TESTING) - I reviewed patient records, labs, notes, testing and imaging myself where available.  No results found for: WBC, HGB, HCT, MCV, PLT No results found for: NA, K, CL, CO2, GLUCOSE, BUN, CREATININE, CALCIUM, PROT, ALBUMIN, AST, ALT, ALKPHOS, BILITOT, GFRNONAA, GFRAA No results found for: CHOL, HDL, LDLCALC, LDLDIRECT, TRIG, CHOLHDL No results found for: HGBA1C No results found for: VITAMINB12 No results found for: TSH  ASSESSMENT AND PLAN 48 y.o. year old female  has a past medical history of Headache and Hypertension. here with:  1.  Migraine headache  Her migraine headache pattern appears to be stable.  She complains of 4-5 mild to moderate headaches per month, 1 severe headache every 2 months.  She is not interested in CGRP injectable medications at this time. She has tried Lyrica, propranolol, and effexor in the past.  She will continue taking Topamax 250 mg at bedtime, Imitrex nasal spray as needed, and hydrocodone for severe prolonged headache. We discussed taking higher doses of Topamax has greater risk for side effect.  I will send in a prescription for Zofran to take as needed for nausea with  her headaches.  She will follow-up in 6 months or sooner if needed.   I spent 15 minutes with the patient. 50% of this time was spent discussing her plan of care.  Butler Denmark, AGNP-C, DNP 08/19/2019, 8:16 AM [facility] [address], [place], [place] [address] [phone]

## 2019-08-19 ENCOUNTER — Ambulatory Visit: Payer: BC Managed Care – PPO | Admitting: Neurology

## 2019-08-19 ENCOUNTER — Other Ambulatory Visit: Payer: Self-pay

## 2019-08-19 ENCOUNTER — Encounter: Payer: Self-pay | Admitting: Neurology

## 2019-08-19 VITALS — BP 118/86 | HR 80 | Temp 97.3°F | Ht 65.0 in | Wt 147.0 lb

## 2019-08-19 DIAGNOSIS — G43709 Chronic migraine without aura, not intractable, without status migrainosus: Secondary | ICD-10-CM

## 2019-08-19 MED ORDER — TOPIRAMATE 100 MG PO TABS: ORAL_TABLET | ORAL | 3 refills | Status: DC

## 2019-08-19 MED ORDER — ONDANSETRON HCL 4 MG PO TABS: 4.0000 mg | ORAL_TABLET | Freq: Three times a day (TID) | ORAL | 0 refills | Status: DC | PRN

## 2019-08-19 MED ORDER — SUMATRIPTAN 20 MG/ACT NA SOLN: 20.0000 mg | Freq: Two times a day (BID) | NASAL | 3 refills | Status: DC | PRN

## 2019-08-19 NOTE — Patient Instructions (Addendum)
1. Continue current medications, you may try Zofran as needed for nausea with headache  2. Consider Aimovig, Ajovy, or Emgality 3. Return in 6 months

## 2019-08-19 NOTE — Progress Notes (Signed)
I have read the note, and I agree with the clinical assessment and plan.  Orlena Garmon K Malyiah Fellows   

## 2019-11-01 ENCOUNTER — Other Ambulatory Visit: Payer: Self-pay | Admitting: Neurology

## 2020-02-17 ENCOUNTER — Encounter: Payer: Self-pay | Admitting: Neurology

## 2020-02-17 ENCOUNTER — Ambulatory Visit: Payer: BC Managed Care – PPO | Admitting: Neurology

## 2020-02-17 ENCOUNTER — Other Ambulatory Visit: Payer: Self-pay

## 2020-02-17 VITALS — BP 132/82 | HR 45 | Temp 97.0°F | Ht 63.0 in | Wt 153.0 lb

## 2020-02-17 DIAGNOSIS — G43709 Chronic migraine without aura, not intractable, without status migrainosus: Secondary | ICD-10-CM | POA: Diagnosis not present

## 2020-02-17 MED ORDER — TOPIRAMATE 100 MG PO TABS: ORAL_TABLET | ORAL | 3 refills | Status: DC

## 2020-02-17 MED ORDER — SUMATRIPTAN 20 MG/ACT NA SOLN: 20.0000 mg | Freq: Two times a day (BID) | NASAL | 3 refills | Status: DC | PRN

## 2020-02-17 NOTE — Progress Notes (Signed)
I have read the note, and I agree with the clinical assessment and plan.  Lavere Shinsky K Evagelia Knack   

## 2020-02-17 NOTE — Progress Notes (Signed)
PATIENT: Carrie Buckley DOB: [birth year]  REASON FOR VISIT: follow up HISTORY FROM: patient  HISTORY OF PRESENT ILLNESS: Today 02/17/20  Carrie Buckley is a 49 year old female with history of migraine headaches.  She remains on Topamax 250 mg at bedtime.  She tried Lyrica, but it made her feel funny.  She has been unable to tolerate Effexor and propanolol.  She may take Imitrex nasal spray or hydrocodone for a prolonged headache (only 1/2 tablet).  She reports on average she may have 4-5 mild to moderate headaches a month, 1 severe headache every 2 months.  Stress and weather changes are triggers.  She is working from home, has not missed any days of work.  Her 68 year old daughter is living with her, recovering from knee surgery.  She seems to be tolerating the higher dose of Topamax at bedtime, when she took during the day she had side effect.  Her overall health has been good, she just ran 3 miles on her treadmill, glad to be getting back into exercise.  She presents today for evaluation unaccompanied.  HISTORY 08/19/2019 SS: Carrie Buckley is a 49 year old female with history of migraine headaches.  She remains on Topamax 250 mg at bedtime, recently she has been taking 300 mg at bedtime.  She tried Lyrica, but it made her feel funny. She has been unable to tolerate Effexor or propanolol in the past.  She may take Imitrex nasal spray, or hydrocodone for a prolonged headache.  She reports that she may have 4-5 mild to moderate headaches a month.  She may have 1 severe headache every 2 months.  The Imitrex nasal spray has been helpful, she will take half tablet of hydrocodone for prolonged headache.  She indicates her overall health has been well.  Weather change is a trigger for her headaches.  She is working from home, but has not missed any days of work.  She may have nausea with her headaches.  She did have a severe headache yesterday, mild lingering headache today.  Her 49 year old daughter  lives with her, recovering from a knee injury, having knee surgery this week.  She presents today for follow-up unaccompanied.  REVIEW OF SYSTEMS: Out of a complete 14 system review of symptoms, the patient complains only of the following symptoms, and all other reviewed systems are negative.  Headache  ALLERGIES: No Known Allergies  HOME MEDICATIONS: Outpatient Medications Prior to Visit  Medication Sig Dispense Refill  . aspirin 81 MG tablet Take 81 mg by mouth daily.    Marland Kitchen HYDROcodone-acetaminophen (NORCO/VICODIN) 5-325 MG tablet TAKE ONE TABLET EVERY 6 HOURS AS NEEDED FOR MODERATE PAIN. 28 tablet 0  . Multiple Vitamin (MULTIVITAMIN) tablet Take 1 tablet by mouth daily.    . ondansetron (ZOFRAN) 4 MG tablet Take 1 tablet (4 mg total) by mouth every 8 (eight) hours as needed for nausea or vomiting. 20 tablet 0  . SUMAtriptan (IMITREX) 20 MG/ACT nasal spray Place 1 spray (20 mg total) into the nose 2 (two) times daily as needed for migraine or headache. 1 Inhaler 3  . topiramate (TOPAMAX) 100 MG tablet 2.5 tablets at bedtime 250 tablet 3   No facility-administered medications prior to visit.    PAST MEDICAL HISTORY: Past Medical History:  Diagnosis Date  . Headache   . Hypertension     PAST SURGICAL HISTORY: Past Surgical History:  Procedure Laterality Date  . CESAREAN SECTION    . EYE SURGERY    . NASAL SINUS SURGERY    .  SKIN GRAFT Left    jaw  . TUBAL LIGATION      FAMILY HISTORY: Family History  Problem Relation Age of Onset  . Cancer Mother   . Cancer Maternal Aunt        colon  . Hypertension Maternal Aunt   . Hypertension Maternal Uncle   . Hypertension Paternal Aunt   . Hypertension Paternal Uncle   . Migraines Neg Hx     SOCIAL HISTORY: Social History   Socioeconomic History  . Marital status: Married    Spouse name: Carrie Buckley  . Number of children: 1  . Years of education: Not on file  . Highest education level: Not on file  Occupational History  .  Not on file  Tobacco Use  . Smoking status: Never Smoker  . Smokeless tobacco: Never Used  Substance and Sexual Activity  . Alcohol use: No    Alcohol/week: 0.0 standard drinks  . Drug use: No  . Sexual activity: Yes    Birth control/protection: Surgical  Other Topics Concern  . Not on file  Social History Narrative   Patient lives at home with her husband Carrie Buckley).   Patient works full time Audiological scientist.   Education patient is in college now.   Left handed.   Patient does not drink caffeine.   Social Determinants of Health   Financial Resource Strain:   . Difficulty of Paying Living Expenses:   Food Insecurity:   . Worried About Programme researcher, broadcasting/film/video in the Last Year:   . Barista in the Last Year:   Transportation Needs:   . Freight forwarder (Medical):   Marland Kitchen Lack of Transportation (Non-Medical):   Physical Activity:   . Days of Exercise per Week:   . Minutes of Exercise per Session:   Stress:   . Feeling of Stress :   Social Connections:   . Frequency of Communication with Friends and Family:   . Frequency of Social Gatherings with Friends and Family:   . Attends Religious Services:   . Active Member of Clubs or Organizations:   . Attends Banker Meetings:   Marland Kitchen Marital Status:   Intimate Partner Violence:   . Fear of Current or Ex-Partner:   . Emotionally Abused:   Marland Kitchen Physically Abused:   . Sexually Abused:    PHYSICAL EXAM  Vitals:   02/17/20 0807  BP: 132/82  Pulse: (!) 45  Temp: (!) 97 F (36.1 C)  Weight: 153 lb (69.4 kg)  Height: 5\' 3"  (1.6 m)   Body mass index is 27.1 kg/m.  Generalized: Well developed, in no acute distress   Neurological examination  Mentation: Alert oriented to time, place, history taking. Follows all commands speech and language fluent Cranial nerve II-XII: Pupils were equal round reactive to light. Extraocular movements were full, visual field were full on confrontational test. Facial sensation and strength  were normal. Head turning and shoulder shrug were normal and symmetric. Motor: The motor testing reveals 5 over 5 strength of all 4 extremities. Good symmetric motor tone is noted throughout.  Sensory: Sensory testing is intact to soft touch on all 4 extremities. No evidence of extinction is noted.  Coordination: Cerebellar testing reveals good finger-nose-finger and heel-to-shin bilaterally.  Gait and station: Gait is normal. Tandem gait is normal. Romberg is negative. No drift is seen.  Reflexes: Deep tendon reflexes are symmetric and normal bilaterally.   DIAGNOSTIC DATA (LABS, IMAGING, TESTING) - I reviewed patient records, labs,  notes, testing and imaging myself where available.  No results found for: WBC, HGB, HCT, MCV, PLT No results found for: NA, K, CL, CO2, GLUCOSE, BUN, CREATININE, CALCIUM, PROT, ALBUMIN, AST, ALT, ALKPHOS, BILITOT, GFRNONAA, GFRAA No results found for: CHOL, HDL, LDLCALC, LDLDIRECT, TRIG, CHOLHDL No results found for: WNPI0P No results found for: VITAMINB12 No results found for: TSH  ASSESSMENT AND PLAN 49 y.o. year old female  has a past medical history of Headache and Hypertension. here with:  1.  Migraine headache  Her headache pattern appears to be stable.  She will remain on Topamax 250 mg at bedtime, we discussed Topamax has greater risk for side effect at higher doses.  She will remain on Imitrex nasal spray as needed, hydrocodone for severe prolonged headache.  We discussed CGRP option, is not sure about injectable medications right now.  She will follow-up in 6 months or sooner if needed.  I spent 20 minutes of face-to-face and non-face-to-face time with patient.  This included previsit chart review, lab review, study review, order entry, electronic health record documentation, patient education.   Margie Ege, AGNP-C, DNP 02/17/2020, 8:44 AM [facility] [address], [address] [place], [place] [address] [phone]

## 2020-02-17 NOTE — Patient Instructions (Signed)
Continue  Current medications  Consider Ajovy or Emgality for headache prevention (this are the shots we discussed) See you back in 6 months

## 2020-03-07 ENCOUNTER — Other Ambulatory Visit: Payer: Self-pay | Admitting: Neurology

## 2020-03-09 NOTE — Telephone Encounter (Signed)
Pt is up to date on her appts. Pt is due for a refill on norco. [place] Controlled Substance Registry checked and and is appropriate.

## 2020-04-21 ENCOUNTER — Ambulatory Visit
Admission: EM | Admit: 2020-04-21 | Discharge: 2020-04-21 | Disposition: A | Discharge status: Home / Self Care | Payer: BC Managed Care – PPO | Attending: Emergency Medicine | Admitting: Emergency Medicine

## 2020-04-21 ENCOUNTER — Other Ambulatory Visit: Payer: Self-pay

## 2020-04-21 DIAGNOSIS — R21 Rash and other nonspecific skin eruption: Secondary | ICD-10-CM

## 2020-04-21 MED ORDER — VALACYCLOVIR HCL 1 G PO TABS: 1000.0000 mg | ORAL_TABLET | Freq: Three times a day (TID) | ORAL | 0 refills | Status: DC

## 2020-04-21 MED ORDER — TRIAMCINOLONE ACETONIDE 0.5 % EX OINT: 1.0000 "application " | TOPICAL_OINTMENT | Freq: Two times a day (BID) | CUTANEOUS | 0 refills | Status: DC

## 2020-04-21 NOTE — Discharge Instructions (Signed)
Take valtrex 3 times a day with food. Apply triamcinolone 2 times a day for a week. Use sunscreen!!

## 2020-04-21 NOTE — ED Triage Notes (Signed)
Pt has rash to abdomen x 3-4 days, hx of shingles and states it feels the same. Pt also c/o "the glands in my neck are swollen"

## 2020-04-21 NOTE — ED Provider Notes (Signed)
[facility]    CSN: [id number] Arrival date & time: 04/21/20  1826      History   Chief Complaint Chief Complaint  Patient presents with  . Rash    HPI Carrie Buckley is a 49 y.o. female with history of migraines, hypertension presenting for periumbilical rash x4 days.  Patient notes history of shingles: States this feels similar.  Patient denies immunocompromise state, significant stress.  Patient also notes right-sided lymphadenopathy: Had a rash on her scalp which is since resolved prior to this.  No fever, thrashes, myalgias, change in topical products, medications, lifestyle.  Has tried cortisone and calamine without relief.   Past Medical History:  Diagnosis Date  . Headache   . Hypertension     Patient Active Problem List   Diagnosis Date Noted  . Right leg weakness 09/08/2016  . Dizziness and giddiness 07/31/2014  . Migraine 11/09/2012    Past Surgical History:  Procedure Laterality Date  . CESAREAN SECTION    . EYE SURGERY    . NASAL SINUS SURGERY    . SKIN GRAFT Left    jaw  . TUBAL LIGATION      OB History    Gravida  1   Para  1   Term      Preterm      AB      Living  1     SAB      TAB      Ectopic      Multiple      Live Births               Home Medications    Prior to Admission medications   Medication Sig Start Date End Date Taking? Authorizing Provider  HYDROcodone-acetaminophen (NORCO/VICODIN) 5-325 MG tablet TAKE ONE TABLET EVERY 6 HOURS AS NEEDED FOR MODERATE PAIN. 03/09/20   Glean Salvo, NP  aspirin 81 MG tablet Take 81 mg by mouth daily.    [provider]  Multiple Vitamin (MULTIVITAMIN) tablet Take 1 tablet by mouth daily.    [provider]  ondansetron (ZOFRAN) 4 MG tablet Take 1 tablet (4 mg total) by mouth every 8 (eight) hours as needed for nausea or vomiting. 08/19/19   Glean Salvo, NP  SUMAtriptan Willette Brace) 20 MG/ACT nasal spray Place 1 spray (20 mg total) into the  nose 2 (two) times daily as needed for migraine or headache. 02/17/20   Glean Salvo, NP  topiramate (TOPAMAX) 100 MG tablet 2.5 tablets at bedtime 02/17/20   Glean Salvo, NP  triamcinolone ointment (KENALOG) 0.5 % Apply 1 application topically 2 (two) times daily. 04/21/20   Hall-Potvin, [place], PA-C  valACYclovir (VALTREX) 1000 MG tablet Take 1 tablet (1,000 mg total) by mouth 3 (three) times daily. 04/21/20   Hall-Potvin, [place], PA-C    Family History Family History  Problem Relation Age of Onset  . Cancer Mother   . Cancer Maternal Aunt        colon  . Hypertension Maternal Aunt   . Hypertension Maternal Uncle   . Hypertension Paternal Aunt   . Hypertension Paternal Uncle   . Migraines Neg Hx     Social History Social History   Tobacco Use  . Smoking status: Never Smoker  . Smokeless tobacco: Never Used  Substance Use Topics  . Alcohol use: No    Alcohol/week: 0.0 standard drinks  . Drug use: No     Allergies   Patient  has no known allergies.   Review of Systems As per HPI   Physical Exam Triage Vital Signs ED Triage Vitals  Enc Vitals Group     BP      Pulse      Resp      Temp      Temp src      SpO2      Weight      Height      Head Circumference      Peak Flow      Pain Score      Pain Loc      Pain Edu?      Excl. in GC?    No data found.  Updated Vital Signs BP (!) 137/98   Pulse 99   Temp 98.9 F (37.2 C)   Resp 16   SpO2 99%   Visual Acuity Right Eye Distance:   Left Eye Distance:   Bilateral Distance:    Right Eye Near:   Left Eye Near:    Bilateral Near:     Physical Exam Constitutional:      General: She is not in acute distress. HENT:     Head: Normocephalic and atraumatic.  Eyes:     General: No scleral icterus.    Pupils: Pupils are equal, round, and reactive to light.  Neck:     Comments: Mild right posterior cervical LAD Cardiovascular:     Rate and Rhythm: Normal rate.  Pulmonary:     Effort:  Pulmonary effort is normal.  Skin:    Coloration: Skin is not jaundiced or pale.     Comments: Maculovesicular rash around superior aspect of umbilicus.  No streaking to either side, or involvement on back.  Patient does have hyperpigmentation on right area of scalp from previous lesion: Resolved.  Neurological:     Mental Status: She is alert and oriented to person, place, and time.      UC Treatments / Results  Labs (all labs ordered are listed, but only abnormal results are displayed) Labs Reviewed - No data to display  EKG   Radiology No results found.  Procedures Procedures (including critical care time)  Medications Ordered in UC Medications - No data to display  Initial Impression / Assessment and Plan / UC Course  I have reviewed the triage vital signs and the nursing notes.  Pertinent labs & imaging results that were available during my care of the patient were reviewed by me and considered in my medical decision making (see chart for details).     H&P concerning for shingles, though atypical presentation.  Given patient's history and good tolerance of Valtrex, will start this today.  Reviewed supportive care regarding cervical lymphadenopathy as scalp lesion has resolved.  Expect this to resolve without intervention over the next week.  Return precautions discussed, patient verbalized understanding and is agreeable to plan. Final Clinical Impressions(s) / UC Diagnoses   Final diagnoses:  Rash and nonspecific skin eruption     Discharge Instructions     Take valtrex 3 times a day with food. Apply triamcinolone 2 times a day for a week. Use sunscreen!!    ED Prescriptions    Medication Sig Dispense Auth. Provider   triamcinolone ointment (KENALOG) 0.5 % Apply 1 application topically 2 (two) times daily. 30 g Hall-Potvin, [place], PA-C   valACYclovir (VALTREX) 1000 MG tablet Take 1 tablet (1,000 mg total) by mouth 3 (three) times daily. 21 tablet  Hall-Potvin, [place], [place]  PDMP not reviewed this encounter.   Hall-Potvin, [place], [place] 04/22/20 1218

## 2020-04-22 ENCOUNTER — Encounter: Payer: Self-pay | Admitting: Emergency Medicine

## 2020-06-08 ENCOUNTER — Encounter (HOSPITAL_COMMUNITY): Payer: Self-pay

## 2020-06-08 ENCOUNTER — Ambulatory Visit (HOSPITAL_COMMUNITY)
Admission: EM | Admit: 2020-06-08 | Discharge: 2020-06-08 | Disposition: A | Discharge status: Home / Self Care | Payer: BC Managed Care – PPO | Attending: Family Medicine | Admitting: Family Medicine

## 2020-06-08 DIAGNOSIS — R202 Paresthesia of skin: Secondary | ICD-10-CM

## 2020-06-08 NOTE — Discharge Instructions (Signed)
Please monitor your symptoms  Please seek immediate care if your symptoms worsen  Please follow up with your Neurologist.

## 2020-06-08 NOTE — ED Provider Notes (Signed)
[facility]    CSN: [id number] Arrival date & time: 06/08/20  1757      History   Chief Complaint Chief Complaint  Patient presents with   Numbness    HPI ADREONA BRAND is a 49 y.o. female.   She is presenting with glovelike distribution as well as sock-like distribution of paresthesia in the upper and lower extremities.  Has been occurring intermittently for a few days.  She cannot associate it with anything specifically.  She did receive the Covid vaccine on the fifth.  Her Tegretol was also increased.  Denies any slurring of the speech or or hemiparesis.  Symptoms seem to be intermittent in nature.  HPI  Past Medical History:  Diagnosis Date   Headache    Hypertension     Patient Active Problem List   Diagnosis Date Noted   Right leg weakness 09/08/2016   Dizziness and giddiness 07/31/2014   Migraine 11/09/2012    Past Surgical History:  Procedure Laterality Date   CESAREAN SECTION     EYE SURGERY     NASAL SINUS SURGERY     SKIN GRAFT Left    jaw   TUBAL LIGATION      OB History    Gravida  1   Para  1   Term      Preterm      AB      Living  1     SAB      TAB      Ectopic      Multiple      Live Births               Home Medications    Prior to Admission medications   Medication Sig Start Date End Date Taking? Authorizing Provider  HYDROcodone-acetaminophen (NORCO/VICODIN) 5-325 MG tablet TAKE ONE TABLET EVERY 6 HOURS AS NEEDED FOR MODERATE PAIN. 03/09/20   Glean Salvo, NP  aspirin 81 MG tablet Take 81 mg by mouth daily.    [provider]  Multiple Vitamin (MULTIVITAMIN) tablet Take 1 tablet by mouth daily.    [provider]  ondansetron (ZOFRAN) 4 MG tablet Take 1 tablet (4 mg total) by mouth every 8 (eight) hours as needed for nausea or vomiting. 08/19/19   Glean Salvo, NP  SUMAtriptan Willette Brace) 20 MG/ACT nasal spray Place 1 spray (20 mg total) into the nose 2 (two) times  daily as needed for migraine or headache. 02/17/20   Glean Salvo, NP  topiramate (TOPAMAX) 100 MG tablet 2.5 tablets at bedtime 02/17/20   Glean Salvo, NP  triamcinolone ointment (KENALOG) 0.5 % Apply 1 application topically 2 (two) times daily. 04/21/20   Hall-Potvin, [place], PA-C    Family History Family History  Problem Relation Age of Onset   Cancer Mother    Cancer Maternal Aunt        colon   Hypertension Maternal Aunt    Hypertension Maternal Uncle    Hypertension Paternal Aunt    Hypertension Paternal Uncle    Migraines Neg Hx     Social History Social History   Tobacco Use   Smoking status: Never Smoker   Smokeless tobacco: Never Used  Substance Use Topics   Alcohol use: No    Alcohol/week: 0.0 standard drinks   Drug use: No     Allergies   Patient has no known allergies.   Review of Systems Review of Systems  See HPI  Physical  Exam Triage Vital Signs ED Triage Vitals [06/08/20 1901]  Enc Vitals Group     BP (!) 143/96     Pulse Rate (!) 101     Resp 16     Temp 98.3 F (36.8 C)     Temp src      SpO2 100 %     Weight      Height      Head Circumference      Peak Flow      Pain Score 0     Pain Loc      Pain Edu?      Excl. in GC?    No data found.  Updated Vital Signs BP (!) 143/96    Pulse (!) 101    Temp 98.3 F (36.8 C)    Resp 16    LMP  (LMP Unknown)    SpO2 100%   Visual Acuity Right Eye Distance:   Left Eye Distance:   Bilateral Distance:    Right Eye Near:   Left Eye Near:    Bilateral Near:     Physical Exam Gen: NAD, alert, cooperative with exam, well-appearing ENT: normal lips, normal nasal mucosa,  Eye: normal EOM, normal conjunctiva and lids  Skin: no rashes, no areas of induration  Neuro: normal tone, normal sensation to touch Psych:  normal insight, alert and oriented MSK:  Normal strength in upper extremity to resistance. Normal grip strength. No signs of atrophy. Normal  gait. Neurovascularly intact   UC Treatments / Results  Labs (all labs ordered are listed, but only abnormal results are displayed) Labs Reviewed - No data to display  EKG   Radiology No results found.  Procedures Procedures (including critical care time)  Medications Ordered in UC Medications - No data to display  Initial Impression / Assessment and Plan / UC Course  I have reviewed the triage vital signs and the nursing notes.  Pertinent labs & imaging results that were available during my care of the patient were reviewed by me and considered in my medical decision making (see chart for details).     Ms. Abbe Amsterdam is a 49 year old female is presenting with paresthesias in the upper and lower extremity in a glovelike and sock-like distribution.  Clinical exam is reassuring today.  Unclear if this is related to the Covid vaccine versus her Tegretol versus some other form of pathology.  No emergent needs identified today.  Counseled on supportive care and reassurance provided.  Given indications to return.  Final Clinical Impressions(s) / UC Diagnoses   Final diagnoses:  Paresthesia     Discharge Instructions     Please monitor your symptoms  Please seek immediate care if your symptoms worsen  Please follow up with your Neurologist.     ED Prescriptions    None     PDMP not reviewed this encounter.   Myra Rude, MD 06/08/20 2134

## 2020-06-08 NOTE — ED Triage Notes (Signed)
Pt c/o dizziness and numbness to entire face and bilateral feet all day today. Pt states she takes topamax sometimes causes her numbness/tingling, but this is different. Had COVID vaccine on 8/5, concerned she is having a reaction. Pt states her watch has been alerting her that her resting heart rate has been above 130.

## 2020-08-14 ENCOUNTER — Other Ambulatory Visit: Payer: Self-pay | Admitting: Neurology

## 2020-08-17 NOTE — Progress Notes (Signed)
PATIENT: Carrie Buckley DOB: 1971/09/24  REASON FOR VISIT: follow up HISTORY FROM: patient  HISTORY OF PRESENT ILLNESS: Today 08/18/20  Carrie Buckley is a 49 year old female with history of migraine headache.  She is on Topamax 250 mg at bedtime, reportedly tolerating well.  Lyrica made her feel funny.  Could not tolerate Effexor or propanolol.  She has Imitrex nasal spray or hydrocodone for prolonged headache.  Will start with Imitrex nasal spray, will take hydrocodone if nasal spray is not effective.  On average 3 migraines a month, 2 of the 3 are severe.  Acute medication cocktail works well.  Overall health is good.  Continues to work full-time from home.  Still not interested in injectable preventative medicines.  Presents today for evaluation unaccompanied.  HISTORY  02/17/2020 SS: Carrie Buckley is a 49 year old female with history of migraine headaches.  She remains on Topamax 250 mg at bedtime.  She tried Lyrica, but it made her feel funny.  She has been unable to tolerate Effexor and propanolol.  She may take Imitrex nasal spray or hydrocodone for a prolonged headache (only 1/2 tablet).  She reports on average she may have 4-5 mild to moderate headaches a month, 1 severe headache every 2 months.  Stress and weather changes are triggers.  She is working from home, has not missed any days of work.  Her 48 year old daughter is living with her, recovering from knee surgery.  She seems to be tolerating the higher dose of Topamax at bedtime, when she took during the day she had side effect.  Her overall health has been good, she just ran 3 miles on her treadmill, glad to be getting back into exercise.  She presents today for evaluation unaccompanied.  REVIEW OF SYSTEMS: Out of a complete 14 system review of symptoms, the patient complains only of the following symptoms, and all other reviewed systems are negative.  Headache  ALLERGIES: No Known Allergies  HOME MEDICATIONS: Outpatient  Medications Prior to Visit  Medication Sig Dispense Refill  . aspirin 81 MG tablet Take 81 mg by mouth daily.    Marland Kitchen HYDROcodone-acetaminophen (NORCO/VICODIN) 5-325 MG tablet TAKE ONE TABLET EVERY 6 HOURS AS NEEDED FOR MODERATE PAIN. 28 tablet 0  . Multiple Vitamin (MULTIVITAMIN) tablet Take 1 tablet by mouth daily.    . ondansetron (ZOFRAN) 4 MG tablet Take 1 tablet (4 mg total) by mouth every 8 (eight) hours as needed for nausea or vomiting. 20 tablet 0  . SUMAtriptan (IMITREX) 20 MG/ACT nasal spray Place 1 spray (20 mg total) into the nose 2 (two) times daily as needed for migraine or headache. 1 Inhaler 3  . topiramate (TOPAMAX) 100 MG tablet 2.5 tablets at bedtime 250 tablet 3  . triamcinolone ointment (KENALOG) 0.5 % Apply 1 application topically 2 (two) times daily. 30 g 0   No facility-administered medications prior to visit.    PAST MEDICAL HISTORY: Past Medical History:  Diagnosis Date  . Headache   . Hypertension     PAST SURGICAL HISTORY: Past Surgical History:  Procedure Laterality Date  . CESAREAN SECTION    . EYE SURGERY    . NASAL SINUS SURGERY    . SKIN GRAFT Left    jaw  . TUBAL LIGATION      FAMILY HISTORY: Family History  Problem Relation Age of Onset  . Cancer Mother   . Cancer Maternal Aunt        colon  . Hypertension Maternal Aunt   .  Hypertension Maternal Uncle   . Hypertension Paternal Aunt   . Hypertension Paternal Uncle   . Migraines Neg Hx     SOCIAL HISTORY: Social History   Socioeconomic History  . Marital status: Married    Spouse name: Peyton Najjar  . Number of children: 1  . Years of education: Not on file  . Highest education level: Not on file  Occupational History  . Not on file  Tobacco Use  . Smoking status: Never Smoker  . Smokeless tobacco: Never Used  Substance and Sexual Activity  . Alcohol use: No    Alcohol/week: 0.0 standard drinks  . Drug use: No  . Sexual activity: Yes    Birth control/protection: Surgical  Other  Topics Concern  . Not on file  Social History Narrative   Patient lives at home with her husband Peyton Najjar).   Patient works full time Audiological scientist.   Education patient is in college now.   Left handed.   Patient does not drink caffeine.   Social Determinants of Health   Financial Resource Strain:   . Difficulty of Paying Living Expenses: Not on file  Food Insecurity:   . Worried About Programme researcher, broadcasting/film/video in the Last Year: Not on file  . Ran Out of Food in the Last Year: Not on file  Transportation Needs:   . Lack of Transportation (Medical): Not on file  . Lack of Transportation (Non-Medical): Not on file  Physical Activity:   . Days of Exercise per Week: Not on file  . Minutes of Exercise per Session: Not on file  Stress:   . Feeling of Stress : Not on file  Social Connections:   . Frequency of Communication with Friends and Family: Not on file  . Frequency of Social Gatherings with Friends and Family: Not on file  . Attends Religious Services: Not on file  . Active Member of Clubs or Organizations: Not on file  . Attends Banker Meetings: Not on file  . Marital Status: Not on file  Intimate Partner Violence:   . Fear of Current or Ex-Partner: Not on file  . Emotionally Abused: Not on file  . Physically Abused: Not on file  . Sexually Abused: Not on file   PHYSICAL EXAM  Vitals:   08/18/20 0738  BP: 128/74  Weight: 132 lb 3.2 oz (60 kg)  Height: 5\' 3"  (1.6 m)   Body mass index is 23.42 kg/m.  Generalized: Well developed, in no acute distress   Neurological examination  Mentation: Alert oriented to time, place, history taking. Follows all commands speech and language fluent Cranial nerve II-XII: Pupils were equal round reactive to light. Extraocular movements were full, visual field were full on confrontational test. Facial sensation and strength were normal. Head turning and shoulder shrug  were normal and symmetric. Motor: The motor testing reveals 5  over 5 strength of all 4 extremities. Good symmetric motor tone is noted throughout.  Sensory: Sensory testing is intact to soft touch on all 4 extremities. No evidence of extinction is noted.  Coordination: Cerebellar testing reveals good finger-nose-finger and heel-to-shin bilaterally.  Gait and station: Gait is normal.  Reflexes: Deep tendon reflexes are symmetric and normal bilaterally.   DIAGNOSTIC DATA (LABS, IMAGING, TESTING) - I reviewed patient records, labs, notes, testing and imaging myself where available.  No results found for: WBC, HGB, HCT, MCV, PLT No results found for: NA, K, CL, CO2, GLUCOSE, BUN, CREATININE, CALCIUM, PROT, ALBUMIN, AST, ALT, ALKPHOS, BILITOT,  GFRNONAA, GFRAA No results found for: CHOL, HDL, LDLCALC, LDLDIRECT, TRIG, CHOLHDL No results found for: OVAN1B No results found for: VITAMINB12 No results found for: TSH  ASSESSMENT AND PLAN 49 y.o. year old female  has a past medical history of Headache and Hypertension. here with:  1.  Migraine headache -Headaches appear overall stable -Continue Topamax 250 mg at bedtime, reportedly tolerating well at high dose -Continue Imitrex nasal spray as needed for acute headache -Continue hydrocodone for severe prolonged headache -Follow-up in 1 year or sooner needed  I spent 20 minutes of face-to-face and non-face-to-face time with patient.  This included previsit chart review, lab review, study review, order entry, electronic health record documentation, patient education.  Margie Ege, AGNP-C, DNP 08/18/2020, 8:06 AM [facility] [address] [place], [place] [address] [phone]

## 2020-08-18 ENCOUNTER — Encounter: Payer: Self-pay | Admitting: Neurology

## 2020-08-18 ENCOUNTER — Ambulatory Visit: Payer: BC Managed Care – PPO | Admitting: Neurology

## 2020-08-18 ENCOUNTER — Other Ambulatory Visit: Payer: Self-pay

## 2020-08-18 VITALS — BP 128/74 | Ht 63.0 in | Wt 132.2 lb

## 2020-08-18 DIAGNOSIS — G43709 Chronic migraine without aura, not intractable, without status migrainosus: Secondary | ICD-10-CM | POA: Diagnosis not present

## 2020-08-18 MED ORDER — SUMATRIPTAN 20 MG/ACT NA SOLN: 20.0000 mg | Freq: Two times a day (BID) | NASAL | 3 refills | Status: DC | PRN

## 2020-08-18 MED ORDER — TOPIRAMATE 100 MG PO TABS: ORAL_TABLET | ORAL | 3 refills | Status: DC

## 2020-08-18 NOTE — Patient Instructions (Signed)
Continue current medications  See you back in 1 year  

## 2020-08-18 NOTE — Progress Notes (Signed)
I have read the note, and I agree with the clinical assessment and plan.  Carrie Buckley K Carrie Buckley   

## 2021-02-21 ENCOUNTER — Other Ambulatory Visit: Payer: Self-pay | Admitting: Neurology

## 2021-05-03 ENCOUNTER — Other Ambulatory Visit: Payer: Self-pay | Admitting: Neurology

## 2021-07-20 HISTORY — PX: CARPAL TUNNEL RELEASE: SHX101

## 2021-08-19 ENCOUNTER — Ambulatory Visit: Payer: BC Managed Care – PPO | Admitting: Neurology

## 2021-08-19 ENCOUNTER — Encounter: Payer: Self-pay | Admitting: Neurology

## 2021-08-19 VITALS — BP 116/90 | HR 108 | Ht 65.0 in | Wt 138.0 lb

## 2021-08-19 DIAGNOSIS — G43709 Chronic migraine without aura, not intractable, without status migrainosus: Secondary | ICD-10-CM

## 2021-08-19 MED ORDER — TOPIRAMATE 100 MG PO TABS: ORAL_TABLET | ORAL | 3 refills | Status: DC

## 2021-08-19 MED ORDER — SUMATRIPTAN 20 MG/ACT NA SOLN: 20.0000 mg | Freq: Two times a day (BID) | NASAL | 3 refills | Status: DC | PRN

## 2021-08-19 NOTE — Progress Notes (Signed)
Reason for visit: Migraine headache  Carrie Buckley is an 50 y.o. female  History of present illness:  Carrie Buckley is a 50 year old left-handed white female with a history of intractable migraine headache.  The patient is on 250 mg daily of the Topamax, she seems to tolerate this fairly well.  She takes nasal Imitrex if needed and occasionally she will take a hydrocodone tablet for breakthrough pain.  The patient recently had carpal tunnel surgery on the left through Dr. Amanda Pea, she did receive an oxycodone prescription following surgery but she has not really been taking this.  The patient will be undergoing right carpal tunnel syndrome surgery in the next 2 weeks.  The patient reports that she is having about 2-4 migraine headaches a month, she does not believe that her migraines have worsened over time.  She has weather changes as a significant activator for her headache.  She comes to this office for further evaluation.  Past Medical History:  Diagnosis Date   Headache    Hypertension     Past Surgical History:  Procedure Laterality Date   CARPAL TUNNEL RELEASE Left 07/20/2021   CESAREAN SECTION     EYE SURGERY     NASAL SINUS SURGERY     SKIN GRAFT Left    jaw   TUBAL LIGATION      Family History  Problem Relation Age of Onset   Cancer Mother    Cancer Maternal Aunt        colon   Hypertension Maternal Aunt    Hypertension Maternal Uncle    Hypertension Paternal Aunt    Hypertension Paternal Uncle    Migraines Neg Hx     Social history:  reports that she has never smoked. She has never used smokeless tobacco. She reports that she does not drink alcohol and does not use drugs.   No Known Allergies  Medications:  Prior to Admission medications   Medication Sig Start Date End Date Taking? Authorizing Provider  aspirin 81 MG tablet Take 81 mg by mouth daily.    [provider]  HYDROcodone-acetaminophen (NORCO/VICODIN) 5-325 MG tablet TAKE ONE TABLET  BY MOUTH EVERY 6 HOURS AS NEEDED MODERATE PAIN. 05/03/21   York Spaniel, MD  Multiple Vitamin (MULTIVITAMIN) tablet Take 1 tablet by mouth daily.    [provider]  ondansetron (ZOFRAN) 4 MG tablet Take 1 tablet (4 mg total) by mouth every 8 (eight) hours as needed for nausea or vomiting. 08/19/19   Glean Salvo, NP  SUMAtriptan (IMITREX) 20 MG/ACT nasal spray Place 1 spray (20 mg total) into the nose 2 (two) times daily as needed for migraine or headache. 08/18/20   Glean Salvo, NP  topiramate (TOPAMAX) 100 MG tablet 2.5 tablets at bedtime 08/18/20   Glean Salvo, NP    ROS:  Out of a complete 14 system review of symptoms, the patient complains only of the following symptoms, and all other reviewed systems are negative.  Headache Hand numbness  Blood pressure 116/90, pulse (!) 108, height 5\' 5"  (1.651 m), weight 138 lb (62.6 kg).  Physical Exam  General: The patient is alert and cooperative at the time of the examination.  Skin: No significant peripheral edema is noted.   Neurologic Exam  Mental status: The patient is alert and oriented x 3 at the time of the examination. The patient has apparent normal recent and remote memory, with an apparently normal attention span and concentration ability.  Cranial nerves: Facial symmetry is present. Speech is normal, no aphasia or dysarthria is noted. Extraocular movements are full.  On primary gaze, there is some exotropia of the left eye.  Visual fields are full.  Motor: The patient has good strength in all 4 extremities.  Sensory examination: Soft touch sensation is symmetric on the face, arms, and legs.  Coordination: The patient has good finger-nose-finger and heel-to-shin bilaterally.  Gait and station: The patient has a normal gait. Tandem gait is normal. Romberg is negative. No drift is seen.  Reflexes: Deep tendon reflexes are symmetric.   Assessment/Plan:  1.  Migraine headache  The patient will  continue her Topamax and Imitrex, prescriptions were sent in.  We discussed potentially trying one of the newer medications such as Ajovy or Aimovig, but the patient is currently satisfied with her current medication regimen.  The patient will follow up here in 6 months, in the future she can be followed through Dr. Delena Bali.  Marlan Palau MD 08/19/2021 8:19 AM  [facility] [address] [place], [place] [address]  Phone [phone] Fax [phone]

## 2021-12-15 ENCOUNTER — Telehealth: Payer: Self-pay | Admitting: Neurology

## 2021-12-15 NOTE — Telephone Encounter (Signed)
Since Dr. Anne Hahn has retired her hydrocodone will no longer come from this office. I will refill 1 time to allow her to touch base with her PCP to see if they will assume or can be referred to pain management, this will be the last refill from our office. Last filled by Dr. Anne Hahn 05/03/21 # 28 tablets. 09/03/2021 by Dr. Amanda Pea # 40 tablets.

## 2021-12-15 NOTE — Telephone Encounter (Signed)
The patient verbalized understanding that opioids will no longer be provided by our office. She is going to check with her PCP first. She will let us know if a pain management referral is needed.

## 2021-12-15 NOTE — Telephone Encounter (Signed)
Pt is requesting a refill for HYDROcodone-acetaminophen (NORCO/VICODIN) 5-325 MG tablet.  Pharmacy: [facility]

## 2021-12-16 MED ORDER — HYDROCODONE-ACETAMINOPHEN 5-325 MG PO TABS: ORAL_TABLET | ORAL | 0 refills | Status: DC

## 2021-12-16 NOTE — Addendum Note (Signed)
Addended by: Glean Salvo on: 12/16/2021 02:38 PM   Modules accepted: Orders

## 2021-12-16 NOTE — Telephone Encounter (Signed)
I will refill hydrocodone for the last time. Last refill was 09/03/21 from Dr. Amedeo Plenty # 95, Dr. Jannifer Franklin last filled 05/03/21 # 28.  Meds ordered this encounter  Medications   HYDROcodone-acetaminophen (NORCO/VICODIN) 5-325 MG tablet    Sig: TAKE ONE TABLET BY MOUTH EVERY 6 HOURS AS NEEDED MODERATE PAIN.    Dispense:  28 tablet    Refill:  0

## 2022-02-22 NOTE — Progress Notes (Signed)
? ? ?Patient: Carrie Buckley ?Date of Birth: August 18, 1971 ? ?Reason for Visit: Follow up for migraine ?History from: Patient ?Primary Neurologist: Dr. Delena Bali ? ?ASSESSMENT AND PLAN ?51 y.o. year old female  ? ?Chronic migraine headache  ? ?-Continue Topamax 250 mg at bedtime for migraine prevention, seems to tolerate this ?-Try Nurtec 75 mg as needed for acute headache, no longer will be receiving hydrocodone for headache treatment from our office ?-Has not had full benefit with Imitrex, Maxalt ?-We discussed injectable medication CGRP, is not interested at this time ?-With recent bump to the head, we talked about imaging, is overall feeling better, neuro exam normal, will hold off, call for any worsening symptoms ?-Follow-up in 6 months or sooner if needed ? ?2. Abnormal MRI of the brain, chronic left cerebellar infarct ? 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 ?HISTORY OF PRESENT ILLNESS: ?Today 02/23/22 ?Carrie Buckley here today for follow-up.  On Topamax 250 mg at bedtime, Imitrex nasal spray if needed.  Has received hydrocodone in the case of significant headache.  This was last sent in February 2023, will no longer be receiving since Dr. Anne Hahn has retired. 4-5 migraines last month.  Imitrex works as a dull headache. Bumped her head over weekend, dog ran under the house, no LOC, was typical headache, no neuro symptoms, feeling better. ? ?Tried and failed: Propanolol, Effexor, Maxalt ? ?HISTORY  ?08/19/2021 Dr. Anne Hahn: Carrie Buckley is a 51 year old left-handed white female with a history of intractable migraine headache. The  patient is on 250 mg daily of the Topamax, she seems to tolerate this fairly well.  She takes nasal Imitrex if needed and occasionally she will take a hydrocodone tablet for breakthrough pain.  The patient recently had carpal tunnel surgery on the left through Dr. Amanda Pea, she did receive an oxycodone prescription following surgery but she has not really been taking this.  The patient will be undergoing right carpal tunnel syndrome surgery in the next 2 weeks.  The patient reports that she is having about 2-4 migraine headaches a month, she does not believe that her migraines have worsened over time.  She has weather changes as a significant activator for her headache.  She comes to this office for further evaluation. ? ?REVIEW OF SYSTEMS: Out of a complete 14 system review of symptoms, the patient complains only of the following symptoms, and all other reviewed systems are negative. ? ?See HPI ? ?ALLERGIES: ?No Known Allergies ? ?HOME MEDICATIONS: ?Outpatient Medications Prior to Visit  ?Medication Sig Dispense Refill  ? aspirin 81 MG tablet Take 81 mg by mouth daily.    ? HYDROcodone-acetaminophen (NORCO/VICODIN) 5-325 MG tablet TAKE ONE TABLET BY MOUTH EVERY 6 HOURS AS NEEDED MODERATE PAIN. 28 tablet 0  ? Multiple Vitamin (MULTIVITAMIN) tablet Take 1 tablet by mouth daily.    ? ondansetron (ZOFRAN) 4 MG tablet Take 1 tablet (4 mg total) by mouth every 8 (eight) hours as needed for nausea or vomiting. 20 tablet 0  ? SUMAtriptan (IMITREX) 20 MG/ACT nasal spray Place 1 spray (20 mg total) into the nose 2 (two) times daily as needed for migraine or headache.  1 each 3  ? topiramate (TOPAMAX) 100 MG tablet 2.5 tablets at bedtime 250 tablet 3  ? ?No facility-administered medications prior to visit.  ? ? ?PAST MEDICAL HISTORY: ?Past Medical History:  ?Diagnosis Date  ? Headache   ? Hypertension   ? ? ?PAST SURGICAL HISTORY: ?Past Surgical History:  ?Procedure Laterality Date  ? CARPAL TUNNEL RELEASE Left 07/20/2021  ?  CESAREAN SECTION    ? EYE SURGERY    ? NASAL SINUS SURGERY    ? SKIN GRAFT Left   ? jaw  ? TUBAL LIGATION    ? ? ?FAMILY HISTORY: ?Family History  ?Problem Relation Age of Onset  ? Cancer Mother   ? Cancer Maternal Aunt   ?     colon  ? Hypertension Maternal Aunt   ? Hypertension Maternal Uncle   ? Hypertension Paternal Aunt   ? Hypertension Paternal Uncle   ? Migraines Neg Hx   ? ? ?SOCIAL HISTORY: ?Social History  ? ?Socioeconomic History  ? Marital status: Married  ?  Spouse name: Peyton NajjarLarry  ? Number of children: 1  ? Years of education: Not on file  ? Highest education level: Not on file  ?Occupational History  ? Not on file  ?Tobacco Use  ? Smoking status: Never  ? Smokeless tobacco: Never  ?Substance and Sexual Activity  ? Alcohol use: No  ?  Alcohol/week: 0.0 standard drinks  ? Drug use: No  ? Sexual activity: Yes  ?  Birth control/protection: Surgical  ?Other Topics Concern  ? Not on file  ?Social History Narrative  ? Patient lives at home with her husband [name].  ? Patient works full time Audiological scientistaccounting.  ? Education patient is in college now.  ? Left handed.  ? Patient does not drink caffeine.  ? ?Social Determinants of Health  ? ?Financial Resource Strain: Not on file  ?Food Insecurity: Not on file  ?Transportation Needs: Not on file  ?Physical Activity: Not on file  ?Stress: Not on file  ?Social Connections: Not on file  ?Intimate Partner Violence: Not on file  ? ? ?PHYSICAL EXAM ? ?Vitals:  ? 02/23/22 0820  ?BP: 114/83  ?Pulse: 89  ?Weight: 138 lb (62.6 kg)  ?Height: 5\' 5"  (1.651 m)  ? ?Body mass index is 22.96 kg/m?. ? ?Generalized: Well developed, in no acute distress, well-appearing ?Neurological examination  ?Mentation: Alert oriented to time, place, history taking. Follows all commands speech and language fluent ?Cranial nerve II-XII: Pupils were equal round reactive to light. Extraocular movements were full, visual field were full on confrontational test. Facial sensation and strength were normal.  Head  turning and shoulder shrug  were normal and symmetric. ?Motor: The motor testing reveals 5 over 5 strength of all 4 extremities. Good symmetric motor tone is noted throughout.  ?Sensory: Sensory testing is intact to soft touch on all 4 extremities. No evidence of extinction is noted.  ?Coordination: Cerebellar testing reveals good finger-nose-finger and heel-to-shin bilaterally.  ?Gait and station: Gait is normal. Tandem gait is normal. ?Reflexes: Deep tendon reflexes are symmetric and normal bilaterally.  ? ?DIAGNOSTIC DATA (LABS, IMAGING, TESTING) ?- I reviewed patient records, labs, notes, testing and imaging myself where available. ? ?No results found for: WBC, HGB, HCT, MCV, PLT ?No results found for: NA, K, CL, CO2, GLUCOSE, BUN, CREATININE, CALCIUM, PROT, ALBUMIN, AST, ALT, ALKPHOS, BILITOT, GFRNONAA, GFRAA ?No results found for: CHOL, HDL, LDLCALC, LDLDIRECT, TRIG, CHOLHDL ?No results found for: HGBA1C ?No results found for: VITAMINB12 ?  No results found for: TSH ? ?Margie Ege, AGNP-C, DNP 02/23/2022, 8:53 AM ?[facility] ?[address], [address] ?[place], [place] [address] ?[phone] ? ?

## 2022-02-23 ENCOUNTER — Telehealth: Payer: Self-pay | Admitting: *Deleted

## 2022-02-23 ENCOUNTER — Ambulatory Visit: Payer: BC Managed Care – PPO | Admitting: Neurology

## 2022-02-23 VITALS — BP 114/83 | HR 89 | Ht 65.0 in | Wt 138.0 lb

## 2022-02-23 DIAGNOSIS — G43709 Chronic migraine without aura, not intractable, without status migrainosus: Secondary | ICD-10-CM | POA: Diagnosis not present

## 2022-02-23 DIAGNOSIS — R9089 Other abnormal findings on diagnostic imaging of central nervous system: Secondary | ICD-10-CM | POA: Diagnosis not present

## 2022-02-23 MED ORDER — TOPIRAMATE 100 MG PO TABS: ORAL_TABLET | ORAL | 3 refills | Status: DC

## 2022-02-23 MED ORDER — NURTEC 75 MG PO TBDP: 75.0000 mg | ORAL_TABLET | ORAL | 11 refills | Status: DC | PRN

## 2022-02-23 NOTE — Telephone Encounter (Signed)
PA for Nurtec 75mg  started on covermymeds (key: [id number]). Pharmacy coverage through [employer] [phone]). Decision pending. ?

## 2022-02-23 NOTE — Telephone Encounter (Signed)
[id number] approved through 05/26/2022. ?

## 2022-02-23 NOTE — Patient Instructions (Signed)
Try nurtec for acute headache ?Call for any worsening symptoms  ?See you back in 6 months  ? ?Meds ordered this encounter  ?Medications  ? Rimegepant Sulfate (NURTEC) 75 MG TBDP  ?  Sig: Take 75 mg by mouth as needed (take 1 tablet at onset of headache, max is 1 tablet in 24 hours).  ?  Dispense:  8 tablet  ?  Refill:  11  ? topiramate (TOPAMAX) 100 MG tablet  ?  Sig: 2.5 tablets at bedtime  ?  Dispense:  250 tablet  ?  Refill:  3  ? ? ?

## 2022-04-27 ENCOUNTER — Telehealth: Payer: Self-pay | Admitting: *Deleted

## 2022-04-27 NOTE — Telephone Encounter (Signed)
PA for Nurtec 75mg  started on covermymeds (key: [id number]). Pharmacy coverage through [employer] [phone]). Decision pending.

## 2022-04-27 NOTE — Telephone Encounter (Signed)
[id number] approved through 04/28/2023.

## 2022-08-31 NOTE — Progress Notes (Unsigned)
Patient: Carrie Buckley Date of Birth: [birth year]  Reason for Visit: Follow up for migraine History from: Patient Primary Neurologist: Dr. Billey Gosling  ASSESSMENT AND PLAN 51 y.o. year old female   Chronic migraine headache  Abnormal MRI of the brain, chronic left cerebellar infarct Olfactory hallucination Intermittent tingling to the right arm and leg  -Unclear etiology of smelling smoke, will check EEG, symptoms occurred a few weeks after presumed COVID, if EEG is negative, may consider prolonged EEG or trial of higher Topamax or other ASM; also consider ENT evaluation -Check MRI of the brain with and without contrast, MRA head and neck given history of left cerebellar infarct, potential left vertebral artery dissection in 2015; she remains on aspirin 81 mg daily  -For now, continue Topamax 250 mg at bedtime for migraine prevention -Try Ubrelvy 100 mg as needed for acute headache -Previously tried and failed: Imitrex, Maxalt, Nurtec -Check BMP before MRI with contrast -Schedule with Dr. Billey Gosling in 4 months or so -I reviewed plan with Dr. Billey Gosling as well   HISTORY OF PRESENT ILLNESS: Today 09/01/22 Carrie Buckley is here today for follow-up. Reports 2-3 migraines a month. Uses Nurtec, filled in August, just took the last tablet last night. Uses Imitrex nasal spray as well. Nurtec works about 50% of the time. Also on topamax 250 mg at bedtime, no side effects, right now not interested in another preventative. Has CTS right September 2022, few months later the left. In Sept 2023, had COVID, with respiratory symptoms. Has noted since then will smell smoke, throughout the day. Not sure if happened before, didn't lose sense of taste or smell with COVID. No headache with it. Tingling to right arm and leg intermittently. Still on aspirin. Started back running.   Update 02/23/22 SS: Carrie Buckley here today for follow-up.  On Topamax 250 mg at bedtime, Imitrex nasal spray if needed.  Has received hydrocodone in  the case of significant headache.  This was last sent in February 2023, will no longer be receiving since Dr. Jannifer Franklin has retired. 4-5 migraines last month.  Imitrex works as a dull headache. Bumped her head over weekend, dog ran under the house, no LOC, was typical headache, no neuro symptoms, feeling better.  Tried and failed: Propanolol, Effexor, Maxalt  HISTORY  08/19/2021 Dr. Jannifer Franklin: Carrie Buckley is a 51 year old left-handed white female with a history of intractable migraine headache. The patient is on 250 mg daily of the Topamax, she seems to tolerate this fairly well.  She takes nasal Imitrex if needed and occasionally she will take a hydrocodone tablet for breakthrough pain.  The patient recently had carpal tunnel surgery on the left through Dr. Amedeo Plenty, she did receive an oxycodone prescription following surgery but she has not really been taking this.  The patient will be undergoing right carpal tunnel syndrome surgery in the next 2 weeks.  The patient reports that she is having about 2-4 migraine headaches a month, she does not believe that her migraines have worsened over time.  She has weather changes as a significant activator for her headache.  She comes to this office for further evaluation.  REVIEW OF SYSTEMS: Out of a complete 14 system review of symptoms, the patient complains only of the following symptoms, and all other reviewed systems are negative.  See HPI  ALLERGIES: No Known Allergies  HOME MEDICATIONS: Outpatient Medications Prior to Visit  Medication Sig Dispense Refill   aspirin 81 MG tablet Take 81 mg by mouth daily.  HYDROcodone-acetaminophen (NORCO/VICODIN) 5-325 MG tablet TAKE ONE TABLET BY MOUTH EVERY 6 HOURS AS NEEDED MODERATE PAIN. 28 tablet 0   Multiple Vitamin (MULTIVITAMIN) tablet Take 1 tablet by mouth daily.     ondansetron (ZOFRAN) 4 MG tablet Take 1 tablet (4 mg total) by mouth every 8 (eight) hours as needed for nausea or vomiting. 20 tablet 0    Rimegepant Sulfate (NURTEC) 75 MG TBDP Take 75 mg by mouth as needed (take 1 tablet at onset of headache, max is 1 tablet in 24 hours). 8 tablet 11   SUMAtriptan (IMITREX) 20 MG/ACT nasal spray Place 1 spray (20 mg total) into the nose 2 (two) times daily as needed for migraine or headache. 1 each 3   topiramate (TOPAMAX) 100 MG tablet 2.5 tablets at bedtime 250 tablet 3   No facility-administered medications prior to visit.    PAST MEDICAL HISTORY: Past Medical History:  Diagnosis Date   Headache    Hypertension     PAST SURGICAL HISTORY: Past Surgical History:  Procedure Laterality Date   CARPAL TUNNEL RELEASE Left 07/20/2021   CESAREAN SECTION     EYE SURGERY     NASAL SINUS SURGERY     SKIN GRAFT Left    jaw   TUBAL LIGATION      FAMILY HISTORY: Family History  Problem Relation Age of Onset   Cancer Mother    Cancer Maternal Aunt        colon   Hypertension Maternal Aunt    Hypertension Maternal Uncle    Hypertension Paternal Aunt    Hypertension Paternal Uncle    Migraines Neg Hx     SOCIAL HISTORY: Social History   Socioeconomic History   Marital status: Married    Spouse name: Carrie Buckley   Number of children: 1   Years of education: Not on file   Highest education level: Not on file  Occupational History   Not on file  Tobacco Use   Smoking status: Never   Smokeless tobacco: Never  Substance and Sexual Activity   Alcohol use: No    Alcohol/week: 0.0 standard drinks of alcohol   Drug use: No   Sexual activity: Yes    Birth control/protection: Surgical  Other Topics Concern   Not on file  Social History Narrative   Patient lives at home with her husband Carrie Buckley).   Patient works full time Press photographer.   Education patient is in college now.   Left handed.   Patient does not drink caffeine.   Social Determinants of Health   Financial Resource Strain: Not on file  Food Insecurity: Not on file  Transportation Needs: Not on file  Physical Activity:  Not on file  Stress: Not on file  Social Connections: Not on file  Intimate Partner Violence: Not on file   PHYSICAL EXAM  Vitals:   09/01/22 0820  BP: (!) 128/96  Pulse: 88  Weight: 142 lb (64.4 kg)  Height: _0  (1.651 m)    Body mass index is 23.63 kg/m.  Generalized: Well developed, in no acute distress, well-appearing Neurological examination  Mentation: Alert oriented to time, place, history taking. Follows all commands speech and language fluent Cranial nerve II-XII: Pupils were equal round reactive to light. Extraocular movements were full, visual field were full on confrontational test. Facial sensation and strength were normal.  Head turning and shoulder shrug  were normal and symmetric. Motor: The motor testing reveals 5 over 5 strength of all 4 extremities. Good symmetric  motor tone is noted throughout.  Sensory: Sensory testing is intact to soft touch on all 4 extremities. No evidence of extinction is noted.  Coordination: Cerebellar testing reveals good finger-nose-finger and heel-to-shin bilaterally.  Gait and station: Gait is normal. Tandem gait is normal. Reflexes: Deep tendon reflexes are symmetric and normal bilaterally.   DIAGNOSTIC DATA (LABS, IMAGING, TESTING) - I reviewed patient records, labs, notes, testing and imaging myself where available.  No results found for: "WBC", "HGB", "HCT", "MCV", "PLT" No results found for: "NA", "K", "CL", "CO2", "GLUCOSE", "BUN", "CREATININE", "CALCIUM", "PROT", "ALBUMIN", "AST", "ALT", "ALKPHOS", "BILITOT", "GFRNONAA", "GFRAA" No results found for: "CHOL", "HDL", "[place]", "LDLDIRECT", "TRIG", "CHOLHDL" No results found for: "HGBA1C" No results found for: "VITAMINB12" No results found for: "TSH"  Butler Denmark, AGNP-C, DNP 09/01/2022, 8:25 AM [facility] [address] [place], [place] [address] [phone]

## 2022-09-01 ENCOUNTER — Telehealth: Payer: Self-pay | Admitting: Neurology

## 2022-09-01 ENCOUNTER — Encounter: Payer: Self-pay | Admitting: Neurology

## 2022-09-01 ENCOUNTER — Ambulatory Visit: Payer: BC Managed Care – PPO | Admitting: Neurology

## 2022-09-01 VITALS — BP 128/96 | HR 88 | Ht 65.0 in | Wt 142.0 lb

## 2022-09-01 DIAGNOSIS — G43709 Chronic migraine without aura, not intractable, without status migrainosus: Secondary | ICD-10-CM | POA: Diagnosis not present

## 2022-09-01 DIAGNOSIS — R442 Other hallucinations: Secondary | ICD-10-CM | POA: Diagnosis not present

## 2022-09-01 DIAGNOSIS — R9089 Other abnormal findings on diagnostic imaging of central nervous system: Secondary | ICD-10-CM | POA: Diagnosis not present

## 2022-09-01 MED ORDER — UBRELVY 100 MG PO TABS: 100.0000 mg | ORAL_TABLET | ORAL | 11 refills | Status: DC | PRN

## 2022-09-01 NOTE — Patient Instructions (Signed)
Orders Placed This Encounter  Procedures   MR BRAIN W WO CONTRAST   MR ANGIO HEAD WO CONTRAST   MR ANGIO NECK W WO CONTRAST   Basic Metabolic Panel   EEG adult

## 2022-09-01 NOTE — Telephone Encounter (Signed)
LVM asking pt to call back to schedule 4 month f/u with Dr. Delena Bali per Maralyn Sago

## 2022-09-01 NOTE — Telephone Encounter (Signed)
Also, please schedule pt for EEG when pt calls back to schedule f/u

## 2022-09-01 NOTE — Telephone Encounter (Signed)
PA completed on [facility]/[employer] KEY: [id number] Will await determination

## 2022-09-07 ENCOUNTER — Other Ambulatory Visit (INDEPENDENT_AMBULATORY_CARE_PROVIDER_SITE_OTHER): Payer: Self-pay

## 2022-09-07 DIAGNOSIS — Z0289 Encounter for other administrative examinations: Secondary | ICD-10-CM

## 2022-09-07 DIAGNOSIS — R9089 Other abnormal findings on diagnostic imaging of central nervous system: Secondary | ICD-10-CM

## 2022-09-08 LAB — BASIC METABOLIC PANEL
BUN/Creatinine Ratio: 9 (ref 9–23)
BUN: 8 mg/dL (ref 6–24)
CO2: 23 mmol/L (ref 20–29)
Calcium: 9.7 mg/dL (ref 8.7–10.2)
Chloride: 107 mmol/L — ABNORMAL HIGH (ref 96–106)
Creatinine, Ser: 0.91 mg/dL (ref 0.57–1.00)
Glucose: 87 mg/dL (ref 70–99)
Potassium: 4.6 mmol/L (ref 3.5–5.2)
Sodium: 146 mmol/L — ABNORMAL HIGH (ref 134–144)
eGFR: 78 mL/min/{1.73_m2} (ref >59)

## 2022-09-12 ENCOUNTER — Telehealth: Payer: Self-pay | Admitting: Neurology

## 2022-09-12 NOTE — Telephone Encounter (Signed)
BCBS NPR via [employer] sent to GI [phone] 

## 2022-10-03 ENCOUNTER — Ambulatory Visit
Admission: RE | Admit: 2022-10-03 | Discharge: 2022-10-03 | Disposition: A | Discharge status: Home / Self Care | Payer: BC Managed Care – PPO | Source: Ambulatory Visit | Attending: Neurology | Admitting: Neurology

## 2022-10-03 DIAGNOSIS — R9089 Other abnormal findings on diagnostic imaging of central nervous system: Secondary | ICD-10-CM | POA: Diagnosis not present

## 2022-10-03 MED ORDER — GADOPICLENOL 0.5 MMOL/ML IV SOLN
9.0000 mL | Freq: Once | INTRAVENOUS | Status: AC | PRN
  Administered 2022-10-03: 9 mL via INTRAVENOUS

## 2022-10-04 ENCOUNTER — Telehealth: Payer: Self-pay | Admitting: Neurology

## 2022-10-04 DIAGNOSIS — R442 Other hallucinations: Secondary | ICD-10-CM

## 2022-10-04 NOTE — Telephone Encounter (Signed)
I tried to call the patient, I left a message.  I sent her a MyChart message:  MRA of the head was normal.  MRI of the brain did not show any changes.  Continues to show old strokes to the left cerebellar hemisphere.  MRA of the neck was normal.  We are still awaiting the results of your EEG.  How are your olfactory hallucinations doing?  We could potentially do a brief trial of a seizure medication such as Keppra.  Topamax is actually a seizure medications.  If you think you could tolerate higher dosing, we could potentially add on 50 mg in the morning. Other things to consider are seeing ENT since occurred post COVID. Thanks, Storm Dulski   IMPRESSION: This is a normal MR angiogram of the neck arteries with and without contrast.  Of note, the irregularity seen in the proximal cervical segment of the left vertebral artery on the 2015 MR angiogram is not apparent on the current study.   IMPRESSION: This MRI of the brain with and without contrast shows the following: Remote lacunar infarctions of the left cerebellar hemisphere.  These are unchanged in appearance compared to the 09/08/2016 MRI. Single T2/FLAIR hyperintense focus in the posterior left frontal lobe, unchanged in appearance compared to the 09/08/2016 MRI. No acute findings.  Normal enhancement pattern.

## 2022-10-05 ENCOUNTER — Telehealth: Payer: Self-pay | Admitting: Neurology

## 2022-10-05 NOTE — Addendum Note (Signed)
Addended by: Glean Salvo on: 10/05/2022 01:07 PM   Modules accepted: Orders

## 2022-10-05 NOTE — Telephone Encounter (Signed)
Referral for ENT fax to [facility].  Phone: [phone], Fax: [phone]. 

## 2022-10-13 ENCOUNTER — Ambulatory Visit: Payer: BC Managed Care – PPO | Admitting: Neurology

## 2022-10-13 DIAGNOSIS — R4182 Altered mental status, unspecified: Secondary | ICD-10-CM

## 2022-10-13 DIAGNOSIS — R9089 Other abnormal findings on diagnostic imaging of central nervous system: Secondary | ICD-10-CM

## 2022-10-13 NOTE — Procedures (Signed)
    History:  51 year old woman with alteration of awareness and abnormal EEG   EEG classification:  Awake and asleep  Description of the recording: The background rhythms of this recording consists of a fairly well modulated medium amplitude background activity of 12 Hz. As the record progresses, the patient initially is in the waking state, but appears to enter the early stage II sleep during the recording, with rudimentary sleep spindles and vertex sharp wave activity seen. During the wakeful state, photic stimulation is performed, and no abnormal responses were seen. Hyperventilation was also performed, no abnormal response seen. No epileptiform discharges seen during this recording. There was no focal slowing.   Abnormality: None   Impression: This is a normal EEG recording in the waking and sleeping state. No evidence of interictal epileptiform discharges. Normal EEGs, however, do not rule out epilepsy.    Windell Norfolk, MD [facility]

## 2022-11-09 ENCOUNTER — Other Ambulatory Visit (HOSPITAL_COMMUNITY): Payer: Self-pay

## 2022-11-09 ENCOUNTER — Telehealth: Payer: Self-pay

## 2022-11-09 NOTE — Telephone Encounter (Signed)
Pharmacy Patient Advocate Encounter   Received notification from [facility] that prior authorization for Ubrelvy 100MG  tablets is required/requested.    PA submitted on 11/09/2022 to (ins) [employer] via CoverMyMeds Key [id number] Status is pending

## 2022-11-10 ENCOUNTER — Other Ambulatory Visit (HOSPITAL_COMMUNITY): Payer: Self-pay

## 2022-11-10 NOTE — Telephone Encounter (Signed)
Pharmacy Patient Advocate Encounter  Prior Authorization for Carrie Buckley 100MG  tablets has been approved.    PA# PA Case ID #: [id number]  Effective dates: 11/09/2022 through 11/10/2023  Per test billing-Refill Payable on or after 11/12/22

## 2023-03-18 ENCOUNTER — Other Ambulatory Visit: Payer: Self-pay | Admitting: Neurology

## 2023-08-21 ENCOUNTER — Other Ambulatory Visit: Payer: Self-pay

## 2023-08-22 ENCOUNTER — Other Ambulatory Visit: Payer: Self-pay

## 2023-08-22 MED ORDER — SUMATRIPTAN 20 MG/ACT NA SOLN: 20.0000 mg | Freq: Two times a day (BID) | NASAL | 3 refills | Status: DC | PRN

## 2023-09-15 NOTE — Telephone Encounter (Signed)
Pt called stating that she changed insurance on 10/1 so she wants to make sure that the medication was done under the new insurance. Please advise.

## 2023-10-13 ENCOUNTER — Other Ambulatory Visit: Payer: Self-pay | Admitting: Neurology

## 2023-10-17 ENCOUNTER — Telehealth: Payer: Self-pay

## 2023-10-17 ENCOUNTER — Other Ambulatory Visit: Payer: Self-pay | Admitting: Neurology

## 2023-10-17 ENCOUNTER — Other Ambulatory Visit (HOSPITAL_COMMUNITY): Payer: Self-pay

## 2023-10-17 NOTE — Telephone Encounter (Signed)
[employer] ID# [id number] RX BIN# [id number] GROUP# [id number] PROVIDER [phone]

## 2023-10-17 NOTE — Telephone Encounter (Signed)
Pharmacy Patient Advocate Encounter   Received notification from Physician's Office that prior authorization for Ubrelvy 100MG  tablets is required/requested.   Insurance verification completed.   The patient is insured through [facility] .   Per test claim: PA required; PA submitted to above mentioned insurance via CoverMyMeds Key/confirmation #/EOC [id number] Status is pending

## 2023-10-17 NOTE — Telephone Encounter (Signed)
   I was asked to do PA for all neuro meds-per test claim no PA is needed for this med.

## 2023-10-17 NOTE — Telephone Encounter (Signed)
   I was asked to do PA on all neuro meds-per test claim no PA is needed.

## 2023-10-20 ENCOUNTER — Other Ambulatory Visit (HOSPITAL_COMMUNITY): Payer: Self-pay

## 2023-10-20 NOTE — Telephone Encounter (Signed)
Pharmacy Patient Advocate Encounter  Received notification from [facility] that Prior Authorization for Ubrelvy 100MG  tablets has been APPROVED from 10/16/2024 to 01/09/2024. Ran test claim, Copay is $0.00. This test claim was processed through [facility]- copay amounts may vary at other pharmacies due to pharmacy/plan contracts, or as the patient moves through the different stages of their insurance plan.   PA #/Case ID/Reference #: [id number]

## 2023-10-23 MED ORDER — UBRELVY 100 MG PO TABS: 100.0000 mg | ORAL_TABLET | ORAL | 11 refills | Status: AC | PRN

## 2023-10-23 NOTE — Addendum Note (Signed)
Addended by: Danne Harbor on: 10/23/2023 02:40 PM   Modules accepted: Orders

## 2023-12-25 ENCOUNTER — Other Ambulatory Visit: Payer: Self-pay | Admitting: Obstetrics and Gynecology

## 2023-12-25 DIAGNOSIS — Z1231 Encounter for screening mammogram for malignant neoplasm of breast: Secondary | ICD-10-CM

## 2023-12-28 ENCOUNTER — Telehealth: Payer: Self-pay | Admitting: Pharmacist

## 2023-12-28 NOTE — Telephone Encounter (Signed)
 Pharmacy Patient Advocate Encounter  Received notification from [facility] that Prior Authorization for Ubrelvy 100MG  tablets  has been APPROVED from 12/28/2023 to 12/27/2024   PA #/Case ID/Reference #: [id number]

## 2023-12-28 NOTE — Telephone Encounter (Signed)
 Pharmacy Patient Advocate Encounter   Received notification from CoverMyMeds that prior authorization for Ubrelvy 100MG  tablets is required/requested.   Insurance verification completed.   The patient is insured through [facility] .   Per test claim: PA required; PA submitted to above mentioned insurance via CoverMyMeds Key/confirmation #/EOC [id number] Status is pending

## 2024-01-16 NOTE — Progress Notes (Deleted)
 Patient: Carrie Buckley Date of Birth: Apr 02, 1971  Reason for Visit: Follow up for migraine History from: Patient Primary Neurologist: Dr. Delena Bali  ASSESSMENT AND PLAN 53 y.o. year old female   Chronic migraine headache  Abnormal MRI of the brain, chronic left cerebellar infarct Olfactory hallucination Intermittent tingling to the right arm and leg  -Unclear etiology of smelling smoke, will check EEG, symptoms occurred a few weeks after presumed COVID, if EEG is negative, may consider prolonged EEG or trial of higher Topamax or other ASM; also consider ENT evaluation -Check MRI of the brain with and without contrast, MRA head and neck given history of left cerebellar infarct, potential left vertebral artery dissection in 2015; she remains on aspirin 81 mg daily  -For now, continue Topamax 250 mg at bedtime for migraine prevention -Try Ubrelvy 100 mg as needed for acute headache -Previously tried and failed: Imitrex, Maxalt, Nurtec -Check BMP before MRI with contrast -Schedule with Dr. Delena Bali in 4 months or so -I reviewed plan with Dr. Delena Bali as well   HISTORY OF PRESENT ILLNESS: Today 01/16/24 EEG was normal December 2023.  09/01/22 SS: Carrie Buckley is here today for follow-up. Reports 2-3 migraines a month. Uses Nurtec, filled in August, just took the last tablet last night. Uses Imitrex nasal spray as well. Nurtec works about 50% of the time. Also on topamax 250 mg at bedtime, no side effects, right now not interested in another preventative. Has CTS right September 2022, few months later the left. In Sept 2023, had COVID, with respiratory symptoms. Has noted since then will smell smoke, throughout the day. Not sure if happened before, didn't lose sense of taste or smell with COVID. No headache with it. Tingling to right arm and leg intermittently. Still on aspirin. Started back running.   Update 02/23/22 SS: Carrie Buckley here today for follow-up.  On Topamax 250 mg at bedtime, Imitrex nasal  spray if needed.  Has received hydrocodone in the case of significant headache.  This was last sent in February 2023, will no longer be receiving since Dr. Anne Hahn has retired. 4-5 migraines last month.  Imitrex works as a dull headache. Bumped her head over weekend, dog ran under the house, no LOC, was typical headache, no neuro symptoms, feeling better.  Tried and failed: Propanolol, Effexor, Maxalt  HISTORY  08/19/2021 Dr. Anne Hahn: Carrie Buckley is a 53 year old left-handed white female with a history of intractable migraine headache. The patient is on 250 mg daily of the Topamax, she seems to tolerate this fairly well.  She takes nasal Imitrex if needed and occasionally she will take a hydrocodone tablet for breakthrough pain.  The patient recently had carpal tunnel surgery on the left through Dr. Amanda Pea, she did receive an oxycodone prescription following surgery but she has not really been taking this.  The patient will be undergoing right carpal tunnel syndrome surgery in the next 2 weeks.  The patient reports that she is having about 2-4 migraine headaches a month, she does not believe that her migraines have worsened over time.  She has weather changes as a significant activator for her headache.  She comes to this office for further evaluation.  REVIEW OF SYSTEMS: Out of a complete 14 system review of symptoms, the patient complains only of the following symptoms, and all other reviewed systems are negative.  See HPI  ALLERGIES: No Known Allergies  HOME MEDICATIONS: Outpatient Medications Prior to Visit  Medication Sig Dispense Refill   aspirin 81 MG tablet Take  81 mg by mouth daily.     HYDROcodone-acetaminophen (NORCO/VICODIN) 5-325 MG tablet TAKE ONE TABLET BY MOUTH EVERY 6 HOURS AS NEEDED MODERATE PAIN. 28 tablet 0   Multiple Vitamin (MULTIVITAMIN) tablet Take 1 tablet by mouth daily.     ondansetron (ZOFRAN) 4 MG tablet Take 1 tablet (4 mg total) by mouth every 8 (eight) hours as  needed for nausea or vomiting. 20 tablet 0   SUMAtriptan (IMITREX) 20 MG/ACT nasal spray Place 1 spray (20 mg total) into the nose 2 (two) times daily as needed for migraine or headache. 1 each 3   topiramate (TOPAMAX) 100 MG tablet TAKE 2 AND 1/2 TABLETS BY MOUTH AT BEDTIME 250 tablet 3   Ubrogepant (UBRELVY) 100 MG TABS Take 1 tablet (100 mg total) by mouth as needed (take 1 tablet at onset of migraine, may repeat in 2 hours if needed; max is 2 tablet in 24 hours). 10 tablet 11   No facility-administered medications prior to visit.    PAST MEDICAL HISTORY: Past Medical History:  Diagnosis Date   Headache    Hypertension     PAST SURGICAL HISTORY: Past Surgical History:  Procedure Laterality Date   CARPAL TUNNEL RELEASE Left 07/20/2021   CESAREAN SECTION     EYE SURGERY     NASAL SINUS SURGERY     SKIN GRAFT Left    jaw   TUBAL LIGATION      FAMILY HISTORY: Family History  Problem Relation Age of Onset   Cancer Mother    Cancer Maternal Aunt        colon   Hypertension Maternal Aunt    Hypertension Maternal Uncle    Hypertension Paternal Aunt    Hypertension Paternal Uncle    Migraines Neg Hx     SOCIAL HISTORY: Social History   Socioeconomic History   Marital status: Married    Spouse name: Peyton Najjar   Number of children: 1   Years of education: Not on file   Highest education level: Not on file  Occupational History   Not on file  Tobacco Use   Smoking status: Never   Smokeless tobacco: Never  Substance and Sexual Activity   Alcohol use: No    Alcohol/week: 0.0 standard drinks of alcohol   Drug use: No   Sexual activity: Yes    Birth control/protection: Surgical  Other Topics Concern   Not on file  Social History Narrative   Patient lives at home with her husband Peyton Najjar).   Patient works full time Audiological scientist.   Education patient is in college now.   Left handed.   Patient does not drink caffeine.   Social Drivers of Corporate investment banker  Strain: Not on file  Food Insecurity: Not on file  Transportation Needs: Not on file  Physical Activity: Not on file  Stress: Not on file  Social Connections: Not on file  Intimate Partner Violence: Not on file   PHYSICAL EXAM  There were no vitals filed for this visit.   There is no height or weight on file to calculate BMI.  Generalized: Well developed, in no acute distress, well-appearing Neurological examination  Mentation: Alert oriented to time, place, history taking. Follows all commands speech and language fluent Cranial nerve II-XII: Pupils were equal round reactive to light. Extraocular movements were full, visual field were full on confrontational test. Facial sensation and strength were normal.  Head turning and shoulder shrug  were normal and symmetric. Motor: The motor testing  reveals 5 over 5 strength of all 4 extremities. Good symmetric motor tone is noted throughout.  Sensory: Sensory testing is intact to soft touch on all 4 extremities. No evidence of extinction is noted.  Coordination: Cerebellar testing reveals good finger-nose-finger and heel-to-shin bilaterally.  Gait and station: Gait is normal. Tandem gait is normal. Reflexes: Deep tendon reflexes are symmetric and normal bilaterally.   DIAGNOSTIC DATA (LABS, IMAGING, TESTING) - I reviewed patient records, labs, notes, testing and imaging myself where available.  No results found for: "WBC", "HGB", "HCT", "MCV", "PLT"    Component Value Date/Time   NA 146 (H) 09/07/2022 1615   K 4.6 09/07/2022 1615   CL 107 (H) 09/07/2022 1615   CO2 23 09/07/2022 1615   GLUCOSE 87 09/07/2022 1615   BUN 8 09/07/2022 1615   CREATININE 0.91 09/07/2022 1615   CALCIUM 9.7 09/07/2022 1615   No results found for: "CHOL", "HDL", "LDLCALC", "LDLDIRECT", "TRIG", "CHOLHDL" No results found for: "HGBA1C" No results found for: "VITAMINB12" No results found for: "TSH"  Margie Ege, AGNP-C, DNP 01/16/2024, 9:23 PM [facility] [address], [address] [place], [place] [address] [phone]

## 2024-01-17 ENCOUNTER — Telehealth: Payer: Self-pay

## 2024-01-17 ENCOUNTER — Ambulatory Visit: Payer: BC Managed Care – PPO | Admitting: Neurology

## 2024-01-17 NOTE — Telephone Encounter (Signed)
 Lvm to callback. Wanted them to know Maralyn Sago said they can come today at 1245 or tomorrow at their r/s appt.

## 2024-01-17 NOTE — Progress Notes (Unsigned)
 Patient: Carrie Buckley Date of Birth: 11/14/1970  Reason for Visit: Follow up for migraine History from: Patient Primary Neurologist: Dr. Chima/Dr. Teresa Coombs  ASSESSMENT AND PLAN 53 y.o. year old female   Chronic migraine headache  Abnormal MRI of the brain, chronic left cerebellar infarct Olfactory hallucination Intermittent tingling to the right arm and leg  -Referral to ENT for evaluation of olfactory hallucinations post-COVID since 2013 -Already on Topamax 250 mg daily for migraine preventative, which should also serve as seizure benefit. We discussed trial of Trileptal 300 mg BID if no change in olfactory hallucination stop it. She wishes to see ENT first  -EEG is normal December 2023 -MRI of the brain with and without contrast showed remote lacunar infarcts in left cerebellar hemisphere, stable from 2017, single hyperintense focus in posterior left frontal lobe stable since 2017 -MRA head and neck were normal -Continue Ubrelvy 100 mg as needed for acute migraine -Previously tried and failed: Imitrex, Maxalt, Nurtec -Follow up in 1 year or sooner if needed   HISTORY OF PRESENT ILLNESS: Today 01/18/24 EEG was normal December 2023. MRA of the head was normal. MRI of the brain did not show any changes. Continues to show old strokes to the left cerebellar hemisphere. MRA of the neck was normal.   Describes on and off since last year smelling smoke. Here lately smells sewer smell. Today smelled smoke, husband didn't. Not daily. Feels like she is crazy. Tearful today. Smells either smoke or sewer daily at some point during the day. Migraines about 2-4 a month. Remains on Topamax 250 mg daily. Carrie Buckley with good benefit, she likes it, doesn't get a weird feeling, works fast. Has Imitrex nasal spray PRN. Last year got laid off, planning to move to [place] to be closer to her daughter. With olfactory sensation, mostly at her homes, thinks other places, gets whiffs. Remains on  aspirin 81 mg daily.   09/01/22 SS: Carrie Buckley is here today for follow-up. Reports 2-3 migraines a month. Uses Nurtec, filled in August, just took the last tablet last night. Uses Imitrex nasal spray as well. Nurtec works about 50% of the time. Also on topamax 250 mg at bedtime, no side effects, right now not interested in another preventative. Has CTS right September 2022, few months later the left. In Sept 2023, had COVID, with respiratory symptoms. Has noted since then will smell smoke, throughout the day. Not sure if happened before, didn't lose sense of taste or smell with COVID. No headache with it. Tingling to right arm and leg intermittently. Still on aspirin. Started back running.   Update 02/23/22 SS: Carrie Buckley here today for follow-up.  On Topamax 250 mg at bedtime, Imitrex nasal spray if needed.  Has received hydrocodone in the case of significant headache.  This was last sent in February 2023, will no longer be receiving since Dr. Anne Hahn has retired. 4-5 migraines last month.  Imitrex works as a dull headache. Bumped her head over weekend, dog ran under the house, no LOC, was typical headache, no neuro symptoms, feeling better.  Tried and failed: Propanolol, Effexor, Maxalt  HISTORY  08/19/2021 Dr. Anne Hahn: Carrie Buckley is a 53 year old left-handed white female with a history of intractable migraine headache. The patient is on 250 mg daily of the Topamax, she seems to tolerate this fairly well.  She takes nasal Imitrex if needed and occasionally she will take a hydrocodone tablet for breakthrough pain.  The patient recently had carpal tunnel surgery on the left  through Dr. Amanda Pea, she did receive an oxycodone prescription following surgery but she has not really been taking this.  The patient will be undergoing right carpal tunnel syndrome surgery in the next 2 weeks.  The patient reports that she is having about 2-4 migraine headaches a month, she does not believe that her migraines have worsened over  time.  She has weather changes as a significant activator for her headache.  She comes to this office for further evaluation.  REVIEW OF SYSTEMS: Out of a complete 14 system review of symptoms, the patient complains only of the following symptoms, and all other reviewed systems are negative.  See HPI  ALLERGIES: No Known Allergies  HOME MEDICATIONS: Outpatient Medications Prior to Visit  Medication Sig Dispense Refill   aspirin 81 MG tablet Take 81 mg by mouth daily.     glucosamine-chondroitin 500-400 MG tablet Take 1 tablet by mouth 3 (three) times daily.     loratadine-pseudoephedrine (CLARITIN-D 12 HOUR) 5-120 MG tablet      Multiple Vitamin (MULTIVITAMIN) tablet Take 1 tablet by mouth daily.     Turmeric 500 MG CAPS Take by mouth.     Ubrogepant (UBRELVY) 100 MG TABS Take 1 tablet (100 mg total) by mouth as needed (take 1 tablet at onset of migraine, may repeat in 2 hours if needed; max is 2 tablet in 24 hours). 10 tablet 11   Vitamin D-Vitamin K (VITAMIN K2-VITAMIN D3 PO) Take by mouth.     SUMAtriptan (IMITREX) 20 MG/ACT nasal spray Place 1 spray (20 mg total) into the nose 2 (two) times daily as needed for migraine or headache. 1 each 3   topiramate (TOPAMAX) 100 MG tablet TAKE 2 AND 1/2 TABLETS BY MOUTH AT BEDTIME 250 tablet 3   HYDROcodone-acetaminophen (NORCO/VICODIN) 5-325 MG tablet TAKE ONE TABLET BY MOUTH EVERY 6 HOURS AS NEEDED MODERATE PAIN. 28 tablet 0   ondansetron (ZOFRAN) 4 MG tablet Take 1 tablet (4 mg total) by mouth every 8 (eight) hours as needed for nausea or vomiting. (Patient not taking: Reported on 01/18/2024) 20 tablet 0   No facility-administered medications prior to visit.    PAST MEDICAL HISTORY: Past Medical History:  Diagnosis Date   Headache    Hypertension     PAST SURGICAL HISTORY: Past Surgical History:  Procedure Laterality Date   CARPAL TUNNEL RELEASE Left 07/20/2021   CESAREAN SECTION     EYE SURGERY     NASAL SINUS SURGERY     SKIN  GRAFT Left    jaw   TUBAL LIGATION      FAMILY HISTORY: Family History  Problem Relation Age of Onset   Cancer Mother    Cancer Maternal Aunt        colon   Hypertension Maternal Aunt    Hypertension Maternal Uncle    Hypertension Paternal Aunt    Hypertension Paternal Uncle    Migraines Neg Hx     SOCIAL HISTORY: Social History   Socioeconomic History   Marital status: Married    Spouse name: Carrie Buckley   Number of children: 1   Years of education: Not on file   Highest education level: Not on file  Occupational History   Not on file  Tobacco Use   Smoking status: Never   Smokeless tobacco: Never  Substance and Sexual Activity   Alcohol use: No    Alcohol/week: 0.0 standard drinks of alcohol   Drug use: No   Sexual activity: Yes  Birth control/protection: Surgical  Other Topics Concern   Not on file  Social History Narrative   Patient lives at home with her husband Carrie Buckley).   Patient works full time Audiological scientist.   Education patient is in college now.   Left handed.   Patient does not drink caffeine.   Social Drivers of Corporate investment banker Strain: Not on file  Food Insecurity: Not on file  Transportation Needs: Not on file  Physical Activity: Not on file  Stress: Not on file  Social Connections: Not on file  Intimate Partner Violence: Not on file   PHYSICAL EXAM  Vitals:   01/18/24 1035  BP: 113/82  Pulse: 86  Weight: 143 lb (64.9 kg)  Height: 5\' 5"  (1.651 m)     Body mass index is 23.8 kg/m.  Generalized: Well developed, in no acute distress, well-appearing Neurological examination  Mentation: Alert oriented to time, place, history taking. Follows all commands speech and language fluent Cranial nerve II-XII: Pupils were equal round reactive to light. Extraocular movements were full, visual field were full on confrontational test. Facial sensation and strength were normal.  Head turning and shoulder shrug  were normal and  symmetric. Motor: The motor testing reveals 5 over 5 strength of all 4 extremities. Good symmetric motor tone is noted throughout.  Sensory: Sensory testing is intact to soft touch on all 4 extremities. No evidence of extinction is noted.  Coordination: Cerebellar testing reveals good finger-nose-finger and heel-to-shin bilaterally.  Gait and station: Gait is normal. Tandem gait is normal. Reflexes: Deep tendon reflexes are symmetric and normal bilaterally.   DIAGNOSTIC DATA (LABS, IMAGING, TESTING) - I reviewed patient records, labs, notes, testing and imaging myself where available.  No results found for: "WBC", "HGB", "HCT", "MCV", "PLT"    Component Value Date/Time   NA 146 (H) 09/07/2022 1615   K 4.6 09/07/2022 1615   CL 107 (H) 09/07/2022 1615   CO2 23 09/07/2022 1615   GLUCOSE 87 09/07/2022 1615   BUN 8 09/07/2022 1615   CREATININE 0.91 09/07/2022 1615   CALCIUM 9.7 09/07/2022 1615   No results found for: "CHOL", "HDL", "LDLCALC", "LDLDIRECT", "TRIG", "CHOLHDL" No results found for: "HGBA1C" No results found for: "VITAMINB12" No results found for: "TSH"  Margie Ege, AGNP-C, DNP 01/18/2024, 4:11 PM [facility] [address], [address] [place], [place] [address] [phone]

## 2024-01-18 ENCOUNTER — Ambulatory Visit: Admitting: Neurology

## 2024-01-18 ENCOUNTER — Encounter: Payer: Self-pay | Admitting: Neurology

## 2024-01-18 VITALS — BP 113/82 | HR 86 | Ht 65.0 in | Wt 143.0 lb

## 2024-01-18 DIAGNOSIS — G43709 Chronic migraine without aura, not intractable, without status migrainosus: Secondary | ICD-10-CM | POA: Diagnosis not present

## 2024-01-18 DIAGNOSIS — R442 Other hallucinations: Secondary | ICD-10-CM | POA: Diagnosis not present

## 2024-01-18 DIAGNOSIS — R9089 Other abnormal findings on diagnostic imaging of central nervous system: Secondary | ICD-10-CM | POA: Diagnosis not present

## 2024-01-18 MED ORDER — SUMATRIPTAN 20 MG/ACT NA SOLN: 20.0000 mg | Freq: Two times a day (BID) | NASAL | 3 refills | Status: AC | PRN

## 2024-01-18 MED ORDER — TOPIRAMATE 100 MG PO TABS: ORAL_TABLET | ORAL | 3 refills | Status: AC

## 2024-01-18 NOTE — Patient Instructions (Signed)
 Referral to ENT for olfactory hallucination.  Continue Topamax for migraine prevention.  Use Ubrelvy as needed for acute migraine.  Follow-up with me if you would like to try another seizure medication as a trial for the olfactory hallucination.  Follow-up in 1 year.  Thanks

## 2024-01-22 ENCOUNTER — Telehealth: Payer: Self-pay | Admitting: Neurology

## 2024-01-22 NOTE — Telephone Encounter (Signed)
 Referral for ENT fax to [facility]. Phone: [phone], Fax: [phone].

## 2024-05-30 ENCOUNTER — Other Ambulatory Visit (HOSPITAL_COMMUNITY): Payer: Self-pay

## 2024-05-30 ENCOUNTER — Telehealth: Payer: Self-pay

## 2024-05-30 NOTE — Telephone Encounter (Signed)
 Pharmacy Patient Advocate Encounter  Received notification from [employer] that Prior Authorization for Ubrelvy  100MG  tablets has been APPROVED from 05/30/2024 to 05/29/2025   PA #/Case ID/Reference #: PA Case ID #: [id number]

## 2024-05-30 NOTE — Telephone Encounter (Signed)
 Pharmacy Patient Advocate Encounter   Received notification from CoverMyMeds that prior authorization for Ubrelvy  100MG  tablets is required/requested.   Insurance verification completed.   The patient is insured through [facility] .   Per test claim: PA required; PA submitted to above mentioned insurance via CoverMyMeds Key/confirmation #/EOC [id number] Status is pending

## 2024-09-12 ENCOUNTER — Other Ambulatory Visit (HOSPITAL_COMMUNITY): Payer: Self-pay

## 2024-09-12 ENCOUNTER — Telehealth: Payer: Self-pay

## 2024-09-12 NOTE — Telephone Encounter (Signed)
 Pharmacy Patient Advocate Encounter  Received notification from [employer] that Prior Authorization for Sumatriptan  has been APPROVED from 09/12/2024 to 09/11/2025   PA #/Case ID/Reference #: [id number]

## 2024-09-12 NOTE — Telephone Encounter (Signed)
 Pharmacy Patient Advocate Encounter   Received notification from Fax that prior authorization for Sumatriptan  nasal Solution is required/requested.   Insurance verification completed.   The patient is insured through [facility]   Per test claim: PA required; PA submitted to above mentioned insurance via Latent Key/confirmation #/EOC [id number] Status is pending

## 2025-01-21 ENCOUNTER — Ambulatory Visit: Payer: PRIVATE HEALTH INSURANCE | Admitting: Neurology
